# Patient Record
Sex: Male | Born: 1960 | Race: White | Hispanic: No | Marital: Married | Smoking: Current every day smoker
Health system: Southern US, Community
[De-identification: ages and names within clinical notes are randomized; demographics above are authoritative.]

## PROBLEM LIST (undated history)

## (undated) DIAGNOSIS — I10 Essential (primary) hypertension: Secondary | ICD-10-CM

## (undated) DIAGNOSIS — C50911 Malignant neoplasm of unspecified site of right female breast: Secondary | ICD-10-CM

## (undated) DIAGNOSIS — C155 Malignant neoplasm of lower third of esophagus: Secondary | ICD-10-CM

## (undated) HISTORY — DX: Malignant neoplasm of unspecified site of right female breast: C50.911

## (undated) HISTORY — PX: CHOLECYSTECTOMY: SHX55

## (undated) HISTORY — DX: Malignant neoplasm of lower third of esophagus: C15.5

---

## 2018-12-26 ENCOUNTER — Inpatient Hospital Stay (HOSPITAL_COMMUNITY): Payer: Self-pay

## 2018-12-26 ENCOUNTER — Inpatient Hospital Stay (HOSPITAL_COMMUNITY)
Admission: AD | Admit: 2018-12-26 | Discharge: 2018-12-28 | DRG: 066 | Disposition: A | Discharge status: Home / Self Care | Payer: Self-pay | Source: Other Acute Inpatient Hospital | Attending: Internal Medicine | Admitting: Internal Medicine

## 2018-12-26 ENCOUNTER — Encounter (HOSPITAL_COMMUNITY): Payer: Self-pay

## 2018-12-26 DIAGNOSIS — Z7982 Long term (current) use of aspirin: Secondary | ICD-10-CM

## 2018-12-26 DIAGNOSIS — Z79899 Other long term (current) drug therapy: Secondary | ICD-10-CM

## 2018-12-26 DIAGNOSIS — I700 Atherosclerosis of aorta: Secondary | ICD-10-CM | POA: Diagnosis present

## 2018-12-26 DIAGNOSIS — E785 Hyperlipidemia, unspecified: Secondary | ICD-10-CM

## 2018-12-26 DIAGNOSIS — H538 Other visual disturbances: Secondary | ICD-10-CM | POA: Diagnosis present

## 2018-12-26 DIAGNOSIS — Z9049 Acquired absence of other specified parts of digestive tract: Secondary | ICD-10-CM

## 2018-12-26 DIAGNOSIS — R7989 Other specified abnormal findings of blood chemistry: Secondary | ICD-10-CM | POA: Diagnosis present

## 2018-12-26 DIAGNOSIS — Z8249 Family history of ischemic heart disease and other diseases of the circulatory system: Secondary | ICD-10-CM

## 2018-12-26 DIAGNOSIS — D509 Iron deficiency anemia, unspecified: Secondary | ICD-10-CM | POA: Diagnosis present

## 2018-12-26 DIAGNOSIS — H53462 Homonymous bilateral field defects, left side: Secondary | ICD-10-CM | POA: Diagnosis present

## 2018-12-26 DIAGNOSIS — I639 Cerebral infarction, unspecified: Secondary | ICD-10-CM | POA: Diagnosis present

## 2018-12-26 DIAGNOSIS — R42 Dizziness and giddiness: Secondary | ICD-10-CM | POA: Diagnosis present

## 2018-12-26 DIAGNOSIS — J439 Emphysema, unspecified: Secondary | ICD-10-CM | POA: Diagnosis present

## 2018-12-26 DIAGNOSIS — F17210 Nicotine dependence, cigarettes, uncomplicated: Secondary | ICD-10-CM | POA: Diagnosis present

## 2018-12-26 DIAGNOSIS — I10 Essential (primary) hypertension: Secondary | ICD-10-CM

## 2018-12-26 DIAGNOSIS — E663 Overweight: Secondary | ICD-10-CM | POA: Diagnosis present

## 2018-12-26 DIAGNOSIS — I6521 Occlusion and stenosis of right carotid artery: Secondary | ICD-10-CM | POA: Diagnosis present

## 2018-12-26 DIAGNOSIS — R29701 NIHSS score 1: Secondary | ICD-10-CM | POA: Diagnosis present

## 2018-12-26 DIAGNOSIS — Z833 Family history of diabetes mellitus: Secondary | ICD-10-CM

## 2018-12-26 DIAGNOSIS — I63431 Cerebral infarction due to embolism of right posterior cerebral artery: Principal | ICD-10-CM | POA: Diagnosis present

## 2018-12-26 DIAGNOSIS — D72829 Elevated white blood cell count, unspecified: Secondary | ICD-10-CM | POA: Diagnosis present

## 2018-12-26 DIAGNOSIS — Z720 Tobacco use: Secondary | ICD-10-CM

## 2018-12-26 DIAGNOSIS — Z6832 Body mass index (BMI) 32.0-32.9, adult: Secondary | ICD-10-CM

## 2018-12-26 HISTORY — DX: Essential (primary) hypertension: I10

## 2018-12-26 MED ORDER — ACETAMINOPHEN 160 MG/5ML PO SOLN: 650.0000 mg | ORAL | Status: DC | PRN

## 2018-12-26 MED ORDER — LORATADINE 10 MG PO TABS
10.0000 mg | ORAL_TABLET | Freq: Every day | ORAL | Status: DC
  Administered 2018-12-27 – 2018-12-28 (×2): 10 mg via ORAL
  Filled 2018-12-26 (×2): qty 1

## 2018-12-26 MED ORDER — STROKE: EARLY STAGES OF RECOVERY BOOK
Freq: Once | Status: AC
  Administered 2018-12-26: 17:00:00 1

## 2018-12-26 MED ORDER — IOHEXOL 350 MG/ML SOLN
100.0000 mL | Freq: Once | INTRAVENOUS | Status: AC | PRN
  Administered 2018-12-26: 100 mL via INTRAVENOUS

## 2018-12-26 MED ORDER — ACETAMINOPHEN 650 MG RE SUPP: 650.0000 mg | RECTAL | Status: DC | PRN

## 2018-12-26 MED ORDER — ACETAMINOPHEN 325 MG PO TABS
650.0000 mg | ORAL_TABLET | ORAL | Status: DC | PRN
  Administered 2018-12-26 – 2018-12-27 (×3): 650 mg via ORAL
  Filled 2018-12-26 (×3): qty 2

## 2018-12-26 MED ORDER — NICOTINE 21 MG/24HR TD PT24
21.0000 mg | MEDICATED_PATCH | Freq: Every day | TRANSDERMAL | Status: DC
  Administered 2018-12-26 – 2018-12-28 (×3): 21 mg via TRANSDERMAL
  Filled 2018-12-26 (×3): qty 1

## 2018-12-26 MED ORDER — HYDRALAZINE HCL 20 MG/ML IJ SOLN: 10.0000 mg | Freq: Four times a day (QID) | INTRAMUSCULAR | Status: DC | PRN

## 2018-12-26 MED ORDER — SODIUM CHLORIDE 0.9 % IV SOLN
INTRAVENOUS | Status: DC
  Administered 2018-12-26: 17:00:00 via INTRAVENOUS

## 2018-12-26 MED ORDER — TRAMADOL HCL 50 MG PO TABS: 50.0000 mg | ORAL_TABLET | Freq: Four times a day (QID) | ORAL | Status: DC | PRN

## 2018-12-26 MED ORDER — ENOXAPARIN SODIUM 40 MG/0.4ML ~~LOC~~ SOLN
40.0000 mg | SUBCUTANEOUS | Status: DC
  Administered 2018-12-26: 23:00:00 40 mg via SUBCUTANEOUS
  Filled 2018-12-26 (×2): qty 0.4

## 2018-12-26 MED ORDER — SENNOSIDES-DOCUSATE SODIUM 8.6-50 MG PO TABS: 1.0000 | ORAL_TABLET | Freq: Every evening | ORAL | Status: DC | PRN

## 2018-12-26 NOTE — Consult Note (Signed)
V[facility]ist of [place]  Patient name: Marc Terry MRN: [id number] DOB: 1961-07-26 Sex: male  REASON FOR CONSULT: Evaluation of right carotid stenosis  HPI: Marc Terry is a 58 y.o. male, who is seen for evaluation of neurologic change and right carotid stenosis.  He reports that 6 days ago he had an episode where he noted a headache mostly on the right side and blurred vision bilaterally when he awoke.  This did not resolve and he saw his primary care physician.  He was felt to have a sinus infection and was started on antibiotics.  He also underwent carotid duplex in his internal medicine physician's office and this was interpreted as 90% right carotid stenosis.  He reports that he did not have any worsening but also did not having improvement and therefore presented to the [facility] emergency department earlier today.  CT scan showed right posterior stroke.  He was transferred to [facility] for neurology consultation and vascular surgery consultation.  He is right-handed.  He has had no focal neurologic deficits or a aphasia.  He denies any prior similar events.  He denies any history of cardiac disease.  He is a cigarette smoker.  Past Medical History:  Diagnosis Date  . Hypertension     No family history on file.  SOCIAL HISTORY: Social History   Socioeconomic History  . Marital status: Married    Spouse name: Not on file  . Number of children: Not on file  . Years of education: Not on file  . Highest education level: Not on file  Occupational History  . Not on file  Social Needs  . Financial resource strain: Not on file  . Food insecurity:    Worry: Not on file    Inability: Not on file  . Transportation needs:    Medical: Not on file    Non-medical: Not on file  Tobacco Use  . Smoking status: Current Every Day Smoker    Packs/day: 2.00    Years: 20.00    Pack years: 40.00    Types: Cigarettes   Substance and Sexual Activity  . Alcohol use: Never    Frequency: Never  . Drug use: Never  . Sexual activity: Not on file  Lifestyle  . Physical activity:    Days per week: Not on file    Minutes per session: Not on file  . Stress: Not on file  Relationships  . Social connections:    Talks on phone: Not on file    Gets together: Not on file    Attends religious service: Not on file    Active member of club or organization: Not on file    Attends meetings of clubs or organizations: Not on file    Relationship status: Not on file  . Intimate partner violence:    Fear of current or ex partner: Not on file    Emotionally abused: Not on file    Physically abused: Not on file    Forced sexual activity: Not on file  Other Topics Concern  . Not on file  Social History Narrative  . Not on file    No Known Allergies  Current Facility-Administered Medications  Medication Dose Route Frequency Provider Last Rate Last Dose  .  stroke: mapping our early stages of recovery book   Does not apply Once Tat, David, MD      . 0.9 %  sodium chloride infusion   Intravenous Continuous  Orson Eva, MD      . acetaminophen (TYLENOL) tablet 650 mg  650 mg Oral Q4H PRN Tat, Shanon Brow, MD       Or  . acetaminophen (TYLENOL) solution 650 mg  650 mg Per Tube Q4H PRN Tat, Shanon Brow, MD       Or  . acetaminophen (TYLENOL) suppository 650 mg  650 mg Rectal Q4H PRN Tat, Shanon Brow, MD      . enoxaparin (LOVENOX) injection 40 mg  40 mg Subcutaneous Q24H Tat, David, MD      . hydrALAZINE (APRESOLINE) injection 10 mg  10 mg Intravenous Q6H PRN Tat, Shanon Brow, MD      . Derrill Memo ON 12/27/2018] loratadine (CLARITIN) tablet 10 mg  10 mg Oral Daily Tat, David, MD      . senna-docusate (Senokot-S) tablet 1 tablet  1 tablet Oral QHS PRN Tat, Shanon Brow, MD      . traMADol Veatrice Bourbon) tablet 50 mg  50 mg Oral Q6H PRN Tat, David, MD        REVIEW OF SYSTEMS:  Reviewed in his history and physical with nothing to add   PHYSICAL EXAM:  Vitals:   12/26/18 1555  BP: (!) 147/79  Pulse: 95  Resp: 18  Temp: 98.6 F (37 C)  TempSrc: Oral  SpO2: 100%  Weight: 88.1 kg  Height: 5\' 5"  (1.651 m)    GENERAL: The patient is a well-nourished male, in no acute distress. The vital signs are documented above. CARDIOVASCULAR: 2+ radial and 2+ dorsalis pedis pulses bilaterally PULMONARY: There is good air exchange  ABDOMEN: Soft and non-tender  MUSCULOSKELETAL: There are no major deformities or cyanosis. NEUROLOGIC: No focal weakness or paresthesias are detected. SKIN: There are no ulcers or rashes noted. PSYCHIATRIC: The patient has a normal affect.  DATA:  CT from [place] reviewed.  I cannot obtain results or images from his outpatient physician from [place]: New neurologic event 6 days ago with no progression.  Reportedly 90% right internal carotid artery stenosis by duplex.  Probable posterior brain stroke.  MRI of brain and CTA of neck and head pending  I had long discussion with the patient regarding this.  Explained that if he indeed does have a 90% carotid stenosis that we would recommend right carotid endarterectomy.  Explained that this may be an incidental finding and not related to his posterior symptoms.  Also explained that this could potentially be false positive finding at his outpatient lab and would await CT angiogram findings.  I did discuss the operative details of carotid endarterectomy with the patient.  Explained the timing would depend on findings from his MRI and CT.  Will follow   Rosetta Posner, MD [facility] and Ve[facility] Specialists of [facility] Tel [phone] Pager [phone]

## 2018-12-26 NOTE — Consult Note (Signed)
Referring Physician: Dr. Carles Collet    Chief Complaint: Peripheral visual loss  HPI: Marc Terry is a right-handed 58 y.o. male with a PMHx of HTN who was transferred to [facility] from [facility] for Neurology and Vascular Surgery consultations after a left PCA stroke was seen on CT. The patient was in his USOH until waking up last Thursday with peripheral vision loss. He cannot recall which side of his vision was worse, but doe remember bumping into something on his left side when ambulating to his bathroom. He also had a severe headache to the top of his head at that time. He went to see his PCP on 4/17, who felt that the patient had a sinus infection, so he was started on ABX. Carotid ultrasound at his PCP's office revealed 90% right carotid stenosis. The patient subsequently went to the [facility] ED today for further evaluation as his vision had still not improved. CT there showed an acute to subacute right PCA stroke in the parieto-occipital region. Teleneurology was consulted and an inpatient neurology evaluation was recommended by them.    The patient denies any other neurological symptoms including no speech deficit, confusion, dysphagia, limb numbness, limb weakness or incoordination. Also with no cough, anosmia, CP, current fever, diarrhea, abdominal pain, rash, facial droop or B/B incontinence.   Past Medical History:  Diagnosis Date  . Hypertension     No family history on file. Social History:  reports that he has been smoking cigarettes. He has a 40.00 pack-year smoking history. He does not have any smokeless tobacco history on file. He reports that he does not drink alcohol or use drugs.  Allergies: No Known Allergies  Medications:  Scheduled: . enoxaparin (LOVENOX) injection  40 mg Subcutaneous Q24H  . [START ON 12/27/2018] loratadine  10 mg Oral Daily    ROS: As per HPI. All other systems negative.   Physical Examination: Blood pressure 135/66, pulse 92, temperature 98.1 F (36.7 C),  temperature source Oral, resp. rate 18, height 5\' 5"  (1.651 m), weight 88.1 kg, SpO2 100 %.  HEENT: [place]/AT Lungs: Respirations unlabored Ext: No edema  Neurologic Examination: Mental Status: Alert, oriented, thought content appropriate.  Speech fluent without evidence of aphasia.  Able to follow all commands without difficulty. Cranial Nerves: II:  Homonymous left inferior quadrantanopsia. PERRL.  III,IV, VI: EOMI without nystagmus. No ptosis.  V,VII: Subtle lag on the left with grimace. Facial temp sensation slightly decreased on the left VIII: hearing intact to voice IX,X: Palate rises symmetrically XI: Symmetric XII: midline tongue extension  Motor: Right : Upper extremity   5/5    Left:     Upper extremity   5/5  Lower extremity   5/5     Lower extremity   5/5 Normal tone throughout; no atrophy noted No pronator drift.  Sensory: Temp and light touch intact x 4 without extinction. Deep Tendon Reflexes:  2+ bilateral brachioradialis and biceps 4+ right patellar (crossed adductor response) 2+ left patellar 2+ achilles bilaterally Plantars: Right: downgoing   Left: downgoing Cerebellar: No ataxia with FNF bilaterally Gait: Deferred  No results found for this or any previous visit (from the past 48 hour(s)). Ct Angio Head W Or Wo Contrast  Result Date: 12/26/2018 CLINICAL DATA:  Abnormal CT. Blurred vision. Dizziness. Loss of peripheral vision. EXAM: CT ANGIOGRAPHY HEAD AND NECK TECHNIQUE: Multidetector CT imaging of the head and neck was performed using the standard protocol during bolus administration of intravenous contrast. Multiplanar CT image reconstructions and MIPs  were obtained to evaluate the vascular anatomy. Carotid stenosis measurements (when applicable) are obtained utilizing NASCET criteria, using the distal internal carotid diameter as the denominator. CONTRAST:  1100mL OMNIPAQUE IOHEXOL 350 MG/ML SOLN COMPARISON:  CT head earlier today at [facility]. FINDINGS:  CT HEAD FINDINGS Brain: Acute appearing RIGHT PCA territory infarct, no definite hemorrhage, primarily affecting the posterior lobe and occipital lobe on the RIGHT. No other areas of concern for ischemia. No hemorrhage, mass lesion, hydrocephalus, or extra-axial fluid. Vascular: Reported separately. Skull: Normal. Negative for fracture or focal lesion. Sinuses: Imaged portions are clear. Orbits: No acute finding. Review of the MIP images confirms the above findings CTA NECK FINDINGS Aortic arch: Standard branching. Imaged portion shows no evidence of aneurysm or dissection. No significant stenosis of the major arch vessel origins. Aortic atherosclerosis. Right carotid system: Calcified and noncalcified stenosis proximal RIGHT internal carotid artery, approximate 60-70% stenosis, based on luminal measurements of 2.0/4.6 proximal/distal. This is borderline flow reducing. More distally, no dissection or fibromuscular change. Left carotid system: No evidence of dissection, stenosis (50% or greater) or occlusion. Nonstenotic atheromatous change at the bifurcation, less severe than on the RIGHT. Vertebral arteries: RIGHT vertebral artery is patent throughout and dominant contributor to the basilar. 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 Muscular collaterals reconstitute V2 and V3. Vessel terminates in PICA. Skeleton: Spondylosis.  Patient is largely edentulous. Other neck: No masses.  Airway patent. Upper chest: Severe emphysematous change.  No pneumothorax. Review of the MIP images confirms the above findings CTA HEAD FINDINGS Anterior circulation: 50% stenosis of the inferior cavernous ICA on the LEFT. Additional 50-75% stenosis of the supraclinoid ICA on the LEFT. ICA terminus widely patent. Nonstenotic atheromatous change of the cavernous and supraclinoid ICA on the RIGHT. ICA terminus widely patent. Anterior and middle cerebral arteries are free from proximal disease. Rudimentary RIGHT PCOM. Mild irregularity  of the BILATERAL M3 MCA branches. Posterior circulation: RIGHT vertebral is the dominant/sole contributor to the basilar. The basilar is widely patent. The P1, P2, P3 segments of the posterior cerebral arteries bilaterally are mildly irregular, but patent without focal stenosis. No cerebellar branch occlusion. Venous sinuses: As permitted by contrast timing, patent. Anatomic variants: None of significance. Specifically no evidence of fetal RIGHT PCA. Delayed phase: No abnormal postcontrast enhancement. Well demarcated RIGHT PCA territory infarct. Review of the MIP images confirms the above findings IMPRESSION: Bland RIGHT PCA territory infarct without significant disease of the dominant RIGHT vertebral, basilar, or RIGHT PCA. No evidence of fetal PCA anatomy. Extracranial atheromatous change at the carotid bifurcations, not clearly flow reducing. 60-70% stenosis on the RIGHT, less than 50% stenosis on the LEFT. Non dominant LEFT vertebral terminates in PICA. Muscular collaterals reconstitute the LEFT vertebral in the neck. Aortic Atherosclerosis (ICD10-I70.0) and Emphysema (ICD10-J43.9). Electronically Signed   By: Staci Righter M.D.   On: 12/26/2018 21:07   Ct Angio Neck W Or Wo Contrast  Result Date: 12/26/2018 CLINICAL DATA:  Abnormal CT. Blurred vision. Dizziness. Loss of peripheral vision. EXAM: CT ANGIOGRAPHY HEAD AND NECK TECHNIQUE: Multidetector CT imaging of the head and neck was performed using the standard protocol during bolus administration of intravenous contrast. Multiplanar CT image reconstructions and MIPs were obtained to evaluate the vascular anatomy. Carotid stenosis measurements (when applicable) are obtained utilizing NASCET criteria, using the distal internal carotid diameter as the denominator. CONTRAST:  145mL OMNIPAQUE IOHEXOL 350 MG/ML SOLN COMPARISON:  CT head earlier today at [facility]. FINDINGS: CT HEAD FINDINGS Brain: Acute appearing  RIGHT PCA territory infarct, no definite  hemorrhage, primarily affecting the posterior lobe and occipital lobe on the RIGHT. No other areas of concern for ischemia. No hemorrhage, mass lesion, hydrocephalus, or extra-axial fluid. Vascular: Reported separately. Skull: Normal. Negative for fracture or focal lesion. Sinuses: Imaged portions are clear. Orbits: No acute finding. Review of the MIP images confirms the above findings CTA NECK FINDINGS Aortic arch: Standard branching. Imaged portion shows no evidence of aneurysm or dissection. No significant stenosis of the major arch vessel origins. Aortic atherosclerosis. Right carotid system: Calcified and noncalcified stenosis proximal RIGHT internal carotid artery, approximate 60-70% stenosis, based on luminal measurements of 2.0/4.6 proximal/distal. This is borderline flow reducing. More distally, no dissection or fibromuscular change. Left carotid system: No evidence of dissection, stenosis (50% or greater) or occlusion. Nonstenotic atheromatous change at the bifurcation, less severe than on the RIGHT. Vertebral arteries: RIGHT vertebral artery is patent throughout and dominant contributor to the basilar. 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 Muscular collaterals reconstitute V2 and V3. Vessel terminates in PICA. Skeleton: Spondylosis.  Patient is largely edentulous. Other neck: No masses.  Airway patent. Upper chest: Severe emphysematous change.  No pneumothorax. Review of the MIP images confirms the above findings CTA HEAD FINDINGS Anterior circulation: 50% stenosis of the inferior cavernous ICA on the LEFT. Additional 50-75% stenosis of the supraclinoid ICA on the LEFT. ICA terminus widely patent. Nonstenotic atheromatous change of the cavernous and supraclinoid ICA on the RIGHT. ICA terminus widely patent. Anterior and middle cerebral arteries are free from proximal disease. Rudimentary RIGHT PCOM. Mild irregularity of the BILATERAL M3 MCA branches. Posterior circulation: RIGHT vertebral is the  dominant/sole contributor to the basilar. The basilar is widely patent. The P1, P2, P3 segments of the posterior cerebral arteries bilaterally are mildly irregular, but patent without focal stenosis. No cerebellar branch occlusion. Venous sinuses: As permitted by contrast timing, patent. Anatomic variants: None of significance. Specifically no evidence of fetal RIGHT PCA. Delayed phase: No abnormal postcontrast enhancement. Well demarcated RIGHT PCA territory infarct. Review of the MIP images confirms the above findings IMPRESSION: Bland RIGHT PCA territory infarct without significant disease of the dominant RIGHT vertebral, basilar, or RIGHT PCA. No evidence of fetal PCA anatomy. Extracranial atheromatous change at the carotid bifurcations, not clearly flow reducing. 60-70% stenosis on the RIGHT, less than 50% stenosis on the LEFT. Non dominant LEFT vertebral terminates in PICA. Muscular collaterals reconstitute the LEFT vertebral in the neck. Aortic Atherosclerosis (ICD10-I70.0) and Emphysema (ICD10-J43.9). Electronically Signed   By: Staci Righter M.D.   On: 12/26/2018 21:07    Assessment: 58 y.o. male presenting with late subacute right parieto-occipital ischemic infarction.  1. Most likely secondary to a distal right MCA branch occlusion based on exam findings and the documented 90% right carotid artery stenosis. The Radiology report documents a right "PCA territory" infarct; however, on my review of the images the infarction location appears more consistent with a distal right MCA branch territory infarction involving the right posterior-inferior parietal lobe with some involvement of the right superior occipital lobe as well.  2. Stroke Risk Factors - HTN and smoking 3. CTA of head and neck reveals a bland RIGHT PCA territory infarct without significant disease of the dominant RIGHT vertebral, basilar, or RIGHT PCA. No evidence of fetal PCA anatomy. Extracranial atheromatous change at the carotid  bifurcations, not clearly flow reducing. 60-70% stenosis on the RIGHT, less than 50% stenosis on the LEFT. Non dominant LEFT vertebral terminates in PICA. Muscular  collaterals reconstitute the LEFT vertebral in the neck. Aortic Atherosclerosis.   Plan: 1. Would restart his antihypertensive medications. No indication for permissive HTN at this time, as he is 7 days out from onset of his initial stroke symptoms.   2. MRI of the brain without contrast. No need for MRA as CTA of head and neck will be obtained.  3. CTA of head and neck.  4. Echocardiogram 5. ASA 325 mg po qd 6. Risk factor modification to include smoking cessation 7. Telemetry monitoring 8. PT consult, OT consult, Speech consult 9. Start atorvastatin 40 mg po qd. Obtain baseline CK level and LFTs.  10. HgbA1c, fasting lipid panel 11. Vascular surgery has been consulted for possible right CEA   @Electronically  signed: Dr. Kerney Elbe 12/26/2018, 9:20 PM

## 2018-12-26 NOTE — Progress Notes (Signed)
Patient arrived to 3W20 from [place] and oriented x4, no complaints of pain.

## 2018-12-26 NOTE — H&P (Signed)
History and Physical  FABIEN TRAVELSTEAD VZD:[id number] DOB: Jul 14, 1961 DOA: 12/26/2018   PCP: Raelyn Number, MD   Patient coming from: Home  Chief Complaint: blurry vision  HPI:  Marc Terry is a 58 y.o. male with medical history of hypertension with no other documented chronic medical problems presenting with blurred vision and dizziness that started on 12/20/2018.  The patient also complained of some visual loss out of his bilateral peripheral visual fields, left greater than right the patient went to see his PCP on 12/21/2018.  He stated an office ultrasound of the neck was performed.  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  The patient was also given a prescription for amoxicillin for presumptive sinus infection apparently, the patient was given outpatient referral for vascular surgery.  However, the patient's symptoms persisted over the next 3 to 4 days.  As result, the patient presented to the emergency department at [facility] on 12/26/2018.  CT of the brain showed an acute to subacute right PCA stroke in the parieto-occipital region.  Tele-neurology was consulted and recommended inpatient neurology consultation.  Vascular surgery was consulted and recommended transfer to [facility] for further evaluation of his possible carotid stenosis. The patient denies any focal extremity weakness, dysesthesia, dysarthria, facial droop, rashes, chest pain, shortness of breath.  He had some subjective fevers approximately 3 days prior to this admission, but he states that this has resolved.  He denies any coughing, shortness of breath, or hemoptysis.  He denies any COVID contacts.  He has been compliant with social distancing.  In the emergency department, BMP was unremarkable.  EKG showed sinus rhythm without any ST-T wave changes.  WBC was 13.0 with hemoglobin 11.3 and platelets 515,000.  Chest x-ray was negative for acute findings.  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 Hypertension    Past surgical hx: cholecystectomy Social History:  reports that he has been smoking cigarettes. He has a 40.00 pack-year smoking history. He does not have any smokeless tobacco history on file. He reports that he does not drink alcohol or use drugs.   Family hx: mother--DM; father HTN  No Known Allergies   Prior to Admission medications   Medication Sig Start Date End Date Taking? Authorizing Provider  amoxicillin (AMOXIL) 875 MG tablet Take 875 mg by mouth 2 (two) times a day. For 14 days 12/21/18   [provider]  loratadine (CLARITIN) 10 MG tablet Take 10 mg by mouth daily. 12/21/18   [provider]  traMADol (ULTRAM) 50 MG tablet Take 50 mg by mouth every 6 (six) hours as needed for pain. 12/21/18   [provider]  valsartan-hydrochlorothiazide (DIOVAN-HCT) 160-12.5 MG tablet Take 1 tablet by mouth daily. 09/06/18   [provider]    Review of Systems:  Constitutional:  No weight loss, night sweats, Fevers, chills, fatigue.  Head&Eyes: No headache. ENT:  No Difficulty swallowing,Tooth/dental problems,Sore throat,  No ear ache, post nasal drip,  Cardio-vascular:  No chest pain, Orthopnea, PND, swelling in lower extremities,  palpitations  GI:  No  abdominal pain, nausea, vomiting, diarrhea, loss of  appetite, hematochezia, melena, heartburn, indigestion, Resp:  No shortness of breath with exertion or at rest. No cough. No coughing up of blood .No wheezing.No chest wall deformity  Skin:  no rash or lesions.  GU:  no dysuria, change in color of urine, no urgency or frequency. No flank pain.  Musculoskeletal:  No joint pain or swelling. No decreased range of motion. No back pain.  Psych:  No change in mood or affect. No depression or anxiety. Neurologic: No headache, no dysesthesia, no focal weakness, no vision loss. No syncope  Physical Exam: Vitals:   12/26/18 1555  BP: (!) 147/79  Pulse: 95  Resp: 18  Temp: 98.6 F (37 C)  TempSrc: Oral  SpO2: 100%  Weight: 88.1 kg  Height: 5\' 5"  (1.651 m)   General:  A&O x 3, NAD, nontoxic, pleasant/cooperative Head/Eye: No conjunctival hemorrhage, no icterus, [place]/AT, No nystagmus ENT:  No icterus,  No thrush, good dentition, no pharyngeal exudate Neck:  No masses, no lymphadenpathy, no bruits CV:  RRR, no rub, no gallop, no S3 Lung:  CTAB, good air movement, no wheeze, no rhonchi Abdomen: soft/NT, +BS, nondistended, no peritoneal signs Ext: No cyanosis, No rashes, No petechiae, No lymphangitis, No edema Neuro: CNII-XII intact, strength 4/5 in bilateral upper and lower extremities, no dysmetria  Labs on Admission:  Basic Metabolic Panel: No results for input(s): NA, K, CL, CO2, GLUCOSE, BUN, CREATININE, CALCIUM, MG, PHOS in the last 168 hours. Liver Function Tests: No results for input(s): AST, ALT, ALKPHOS, BILITOT, PROT, ALBUMIN in the last 168 hours. No results for input(s): LIPASE, AMYLASE in the last 168 hours. No results for input(s): AMMONIA in the last 168 hours. CBC: No results for input(s): WBC, NEUTROABS, HGB, HCT, MCV, PLT in the last 168 hours. Coagulation Profile: No results for input(s): INR, PROTIME in the last 168 hours. Cardiac Enzymes: No results for input(s): CKTOTAL, CKMB, CKMBINDEX, TROPONINI in the last 168  hours. BNP: Invalid input(s): POCBNP CBG: No results for input(s): GLUCAP in the last 168 hours. Urine analysis: No results found for: COLORURINE, APPEARANCEUR, LABSPEC, PHURINE, GLUCOSEU, HGBUR, BILIRUBINUR, KETONESUR, PROTEINUR, UROBILINOGEN, NITRITE, LEUKOCYTESUR Sepsis Labs: @LABRCNTIP (procalcitonin:4,lacticidven:4) )No results found for this or any previous visit (from the past 240 hour(s)).   Radiological Exams on Admission: No results found.  EKG: Independently reviewed. Sinus, no STT changes    Time spent:60 minutes Code Status:   FULL Family Communication:  No Family at bedside Disposition Plan: expect 1-2 day hospitalization Consults called: neurology and VVS  DVT Prophylaxis: [place] Lovenox  Orson Eva, DO  [facility] Pager [phone]  If 7PM-7AM, please contact night-coverage [email or web address] Password [facility] 12/26/2018, 4:37 PM

## 2018-12-27 ENCOUNTER — Inpatient Hospital Stay (HOSPITAL_COMMUNITY): Payer: Self-pay

## 2018-12-27 DIAGNOSIS — I361 Nonrheumatic tricuspid (valve) insufficiency: Secondary | ICD-10-CM

## 2018-12-27 DIAGNOSIS — E785 Hyperlipidemia, unspecified: Secondary | ICD-10-CM

## 2018-12-27 DIAGNOSIS — I6523 Occlusion and stenosis of bilateral carotid arteries: Secondary | ICD-10-CM

## 2018-12-27 DIAGNOSIS — I6521 Occlusion and stenosis of right carotid artery: Secondary | ICD-10-CM

## 2018-12-27 DIAGNOSIS — I639 Cerebral infarction, unspecified: Secondary | ICD-10-CM

## 2018-12-27 DIAGNOSIS — Z720 Tobacco use: Secondary | ICD-10-CM

## 2018-12-27 LAB — URINALYSIS, COMPLETE (UACMP) WITH MICROSCOPIC
Bacteria, UA: NONE SEEN
Bilirubin Urine: NEGATIVE
Glucose, UA: NEGATIVE mg/dL
Hgb urine dipstick: NEGATIVE
Ketones, ur: NEGATIVE mg/dL
Leukocytes,Ua: NEGATIVE
Nitrite: NEGATIVE
Protein, ur: NEGATIVE mg/dL
Specific Gravity, Urine: 1.025 (ref 1.005–1.030)
pH: 5 (ref 5.0–8.0)

## 2018-12-27 LAB — ECHOCARDIOGRAM COMPLETE
Height: 65 in
Weight: 3104 oz

## 2018-12-27 LAB — BASIC METABOLIC PANEL
Anion gap: 10 (ref 5–15)
BUN: 13 mg/dL (ref 6–20)
CO2: 22 mmol/L (ref 22–32)
Calcium: 8.8 mg/dL — ABNORMAL LOW (ref 8.9–10.3)
Chloride: 104 mmol/L (ref 98–111)
Creatinine, Ser: 1.12 mg/dL (ref 0.61–1.24)
GFR calc Af Amer: >60 mL/min (ref >60)
GFR calc non Af Amer: >60 mL/min (ref >60)
Glucose, Bld: 108 mg/dL — ABNORMAL HIGH (ref 70–99)
Potassium: 4 mmol/L (ref 3.5–5.1)
Sodium: 136 mmol/L (ref 135–145)

## 2018-12-27 LAB — CBC
HCT: 32 % — ABNORMAL LOW (ref 39.0–52.0)
Hemoglobin: 10 g/dL — ABNORMAL LOW (ref 13.0–17.0)
MCH: 26 pg (ref 26.0–34.0)
MCHC: 31.3 g/dL (ref 30.0–36.0)
MCV: 83.1 fL (ref 80.0–100.0)
Platelets: 410 10*3/uL — ABNORMAL HIGH (ref 150–400)
RBC: 3.85 MIL/uL — ABNORMAL LOW (ref 4.22–5.81)
RDW: 15.7 % — ABNORMAL HIGH (ref 11.5–15.5)
WBC: 14 10*3/uL — ABNORMAL HIGH (ref 4.0–10.5)
nRBC: 0 % (ref 0.0–0.2)

## 2018-12-27 LAB — RETICULOCYTES
Immature Retic Fract: 24.8 % — ABNORMAL HIGH (ref 2.3–15.9)
RBC.: 3.85 MIL/uL — ABNORMAL LOW (ref 4.22–5.81)
Retic Count, Absolute: 101 10*3/uL (ref 19.0–186.0)
Retic Ct Pct: 2.6 % (ref 0.4–3.1)

## 2018-12-27 LAB — LIPID PANEL
Cholesterol: 135 mg/dL (ref 0–200)
HDL: 30 mg/dL — ABNORMAL LOW (ref >40)
LDL Cholesterol: 91 mg/dL (ref 0–99)
Total CHOL/HDL Ratio: 4.5 RATIO
Triglycerides: 68 mg/dL (ref <150)
VLDL: 14 mg/dL (ref 0–40)

## 2018-12-27 LAB — IRON AND TIBC
Iron: 24 ug/dL — ABNORMAL LOW (ref 45–182)
Saturation Ratios: 6 % — ABNORMAL LOW (ref 17.9–39.5)
TIBC: 372 ug/dL (ref 250–450)
UIBC: 348 ug/dL

## 2018-12-27 LAB — HEMOGLOBIN A1C
Hgb A1c MFr Bld: 5.4 % (ref 4.8–5.6)
Mean Plasma Glucose: 108.28 mg/dL

## 2018-12-27 LAB — SEDIMENTATION RATE: Sed Rate: 26 mm/hr — ABNORMAL HIGH (ref 0–16)

## 2018-12-27 LAB — HIV ANTIBODY (ROUTINE TESTING W REFLEX): HIV Screen 4th Generation wRfx: NONREACTIVE

## 2018-12-27 LAB — FOLATE: Folate: 9.4 ng/mL (ref >5.9)

## 2018-12-27 LAB — VITAMIN B12: Vitamin B-12: 358 pg/mL (ref 180–914)

## 2018-12-27 LAB — TSH: TSH: 3.575 u[IU]/mL (ref 0.350–4.500)

## 2018-12-27 LAB — FERRITIN: Ferritin: 10 ng/mL — ABNORMAL LOW (ref 24–336)

## 2018-12-27 LAB — C-REACTIVE PROTEIN: CRP: 3.7 mg/dL — ABNORMAL HIGH (ref <1.0)

## 2018-12-27 MED ORDER — ATORVASTATIN CALCIUM 40 MG PO TABS
40.0000 mg | ORAL_TABLET | Freq: Every day | ORAL | Status: DC
  Administered 2018-12-27: 40 mg via ORAL
  Filled 2018-12-27: qty 1

## 2018-12-27 MED ORDER — ASPIRIN 81 MG PO TBEC: 81.0000 mg | DELAYED_RELEASE_TABLET | Freq: Every day | ORAL | Status: DC

## 2018-12-27 MED ORDER — ATORVASTATIN CALCIUM 40 MG PO TABS: 40.0000 mg | ORAL_TABLET | Freq: Every day | ORAL | 1 refills | Status: AC

## 2018-12-27 MED ORDER — ASPIRIN EC 81 MG PO TBEC
81.0000 mg | DELAYED_RELEASE_TABLET | Freq: Every day | ORAL | Status: DC
  Administered 2018-12-27 – 2018-12-28 (×2): 81 mg via ORAL
  Filled 2018-12-27 (×2): qty 1

## 2018-12-27 MED ORDER — CLOPIDOGREL BISULFATE 75 MG PO TABS
75.0000 mg | ORAL_TABLET | Freq: Every day | ORAL | Status: DC
  Administered 2018-12-27 – 2018-12-28 (×2): 75 mg via ORAL
  Filled 2018-12-27 (×2): qty 1

## 2018-12-27 NOTE — Progress Notes (Addendum)
PROGRESS NOTE  Marc Terry OQH:[id number] DOB: 01-21-1961 DOA: 12/26/2018 PCP: Raelyn Number, MD  Brief History:   58 y.o. male with medical history of hypertension with no other documented chronic medical problems presenting with blurred vision and dizziness that started on 12/20/2018.  The patient also complained of some visual loss out of his bilateral peripheral visual fields, left greater than right the patient went to see his PCP on 12/21/2018.  He stated an office ultrasound of the neck was performed.  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  The patient was also given a prescription for amoxicillin for presumptive sinus infection apparently, the patient was given outpatient referral for vascular surgery.  However, the patient's symptoms persisted over the next 3 to 4 days.  As result, the patient presented to the emergency department at [facility] on 12/26/2018.  CT of the brain showed an acute to subacute right PCA stroke in the parieto-occipital region.  Tele-neurology was consulted and recommended inpatient neurology consultation.  Vascular surgery was consulted and recommended transfer to [facility] for further evaluation of his possible carotid stenosis. The patient denies any focal extremity weakness, dysesthesia, dysarthria, facial droop, rashes, chest pain, shortness of breath.  He had some subjective fevers approximately 3 days prior to this admission, but he states that this has resolved.  He denies any coughing, shortness of breath, or hemoptysis.  He denies any COVID contacts.  He has been compliant with social distancing.  In the emergency department, BMP was unremarkable.  EKG showed sinus rhythm without any ST-T wave changes.  WBC was 13.0 with hemoglobin 11.3 and platelets 515,000.  Chest x-ray was negative for acute findings.  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 -Echo--pending -LDL--91 -HbA1C--5.4 -Antiplatelet--ASA 81 mg + plavix 75 mg  Right Carotid Stenosis -VVS following -continue antiplatelets as above -CTA neck as above  Essential Hypertension -holding valsartan/HCTZ to allow for permissive hypertension -hydralazine prn SBP >220  Leukocytosis -Likely stress demargination -Patient is afebrile hemodynamically stable -Obtain urinalysis -Chest x-ray negative for acute findings -hold off on antibiotics  Normocytic anemia -iron saturation 6%,  -B12--358 -folate--9.4 -start po iron  Dyslipidemia -start atorvastatin  Tobacco Abuse -I have discussed tobacco cessation with the patient.  I have counseled the patient regarding the negative impacts of continued tobacco use including but not limited to lung cancer, COPD, and cardiovascular disease.  I have discussed alternatives to tobacco and modalities that may help facilitate tobacco cessation including but not limited to biofeedback, hypnosis, and medications.  Total time spent with tobacco counseling was 4 minutes.      Disposition Plan:   Home when cleared by neurology  Family Communication:   No Family at bedside  Consultants:  neurology  Code Status:  FULL  DVT Prophylaxis:   [place] Lovenox   Procedures: As Listed in Progress Note Above  Antibiotics: None       Subjective: Still having blurry vision out of periphery--about same as last 2 days.  Patient denies fevers, chills, headache, chest pain, dyspnea, nausea, vomiting, diarrhea, abdominal pain, dysuria, hematuria, hematochezia, and melena.  Objective: Vitals:   12/27/18 0150 12/27/18 0350  12/27/18 0413 12/27/18 0737  BP: (!) 157/78 (!) 158/79 (!) 151/74 140/71  Pulse: 79 79 84 94  Resp: 17 18 17 16   Temp: 98.6 F (37 C) 98.1 F (36.7 C) 98 F (36.7 C) 98.5 F (36.9 C)  TempSrc: Oral Oral Oral Oral  SpO2: 95% 99% 100% 100%  Weight:      Height:        Intake/Output Summary (Last 24 hours) at 12/27/2018 0834 Last data filed at 12/27/2018 0737 Gross per 24 hour  Intake 770.96 ml  Output --  Net 770.96 ml   Weight change:  Exam:   General:  Pt is alert, follows commands appropriately, not in acute distress  HEENT: No icterus, No thrush, No neck mass, [place]/AT  Cardiovascular: RRR, S1/S2, no rubs, no gallops  Respiratory: bibasilar rales. No wheeze  Abdomen: Soft/+BS, non tender, non distended, no guarding  Extremities: No edema, No lymphangitis, No petechiae, No rashes, no synovitis   Data Reviewed: I have personally reviewed following labs and imaging studies Basic Metabolic Panel: Recent Labs  Lab 12/27/18 0405  NA 136  K 4.0  CL 104  CO2 22  GLUCOSE 108*  BUN 13  CREATININE 1.12  CALCIUM 8.8*   Liver Function Tests: No results for input(s): AST, ALT, ALKPHOS, BILITOT, PROT, ALBUMIN in the last 168 hours. No results for input(s): LIPASE, AMYLASE in the last 168 hours. No results for input(s): AMMONIA in the last 168 hours. Coagulation Profile: No results for input(s): INR, PROTIME in the last 168 hours. CBC: Recent Labs  Lab 12/27/18 0405  WBC 14.0*  HGB 10.0*  HCT 32.0*  MCV 83.1  PLT 410*   Cardiac Enzymes: No results for input(s): CKTOTAL, CKMB, CKMBINDEX, TROPONINI in the last 168 hours. BNP: Invalid input(s): POCBNP CBG: No results for input(s): GLUCAP in the last 168 hours. HbA1C: Recent Labs    12/27/18 0405  HGBA1C 5.4   Urine analysis: No results found for: COLORURINE, APPEARANCEUR, LABSPEC, PHURINE, GLUCOSEU, HGBUR, BILIRUBINUR, KETONESUR, PROTEINUR, UROBILINOGEN, NITRITE, LEUKOCYTESUR Sepsis  Labs: @LABRCNTIP (procalcitonin:4,lacticidven:4) )No results found for this or any previous visit (from the past 240 hour(s)).   Scheduled Meds:  aspirin EC  81 mg Oral Daily   atorvastatin  40 mg Oral q1800   clopidogrel  75 mg Oral Daily   enoxaparin (LOVENOX) injection  40 mg Subcutaneous Q24H   loratadine  10 mg Oral Daily   nicotine  21 mg Transdermal Daily   Continuous Infusions:  sodium chloride Stopped (12/26/18 1930)    Procedures/Studies: Ct Angio Head W Or Wo Contrast  Result Date: 12/26/2018 CLINICAL DATA:  Abnormal CT. Blurred vision. Dizziness. Loss of peripheral vision. EXAM: CT ANGIOGRAPHY HEAD AND NECK TECHNIQUE: Multidetector CT imaging of the head and neck was performed using the standard protocol during bolus administration of intravenous contrast. Multiplanar CT image reconstructions and MIPs were obtained to evaluate the vascular anatomy. Carotid stenosis measurements (when applicable) are obtained utilizing NASCET criteria, using the distal internal carotid diameter as the denominator. CONTRAST:  13mL OMNIPAQUE IOHEXOL 350 MG/ML SOLN COMPARISON:  CT head earlier today at [facility]. FINDINGS: CT HEAD FINDINGS Brain: Acute appearing RIGHT PCA territory infarct, no definite hemorrhage, primarily affecting the posterior lobe and occipital lobe on the RIGHT. No other areas of concern for ischemia. No hemorrhage, mass lesion, hydrocephalus, or extra-axial fluid. Vascular: Reported separately. Skull: Normal. Negative for fracture or focal lesion. Sinuses: Imaged portions are clear. Orbits:  No acute finding. Review of the MIP images confirms the above findings CTA NECK FINDINGS Aortic arch: Standard branching. Imaged portion shows no evidence of aneurysm or dissection. No significant stenosis of the major arch vessel origins. Aortic atherosclerosis. Right carotid system: Calcified and noncalcified stenosis proximal RIGHT internal carotid artery, approximate 60-70%  stenosis, based on luminal measurements of 2.0/4.6 proximal/distal. This is borderline flow reducing. More distally, no dissection or fibromuscular change. Left carotid system: No evidence of dissection, stenosis (50% or greater) or occlusion. Nonstenotic atheromatous change at the bifurcation, less severe than on the RIGHT. Vertebral arteries: RIGHT vertebral artery is patent throughout and dominant contributor to the basilar. 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 Muscular collaterals reconstitute V2 and V3. Vessel terminates in PICA. Skeleton: Spondylosis.  Patient is largely edentulous. Other neck: No masses.  Airway patent. Upper chest: Severe emphysematous change.  No pneumothorax. Review of the MIP images confirms the above findings CTA HEAD FINDINGS Anterior circulation: 50% stenosis of the inferior cavernous ICA on the LEFT. Additional 50-75% stenosis of the supraclinoid ICA on the LEFT. ICA terminus widely patent. Nonstenotic atheromatous change of the cavernous and supraclinoid ICA on the RIGHT. ICA terminus widely patent. Anterior and middle cerebral arteries are free from proximal disease. Rudimentary RIGHT PCOM. Mild irregularity of the BILATERAL M3 MCA branches. Posterior circulation: RIGHT vertebral is the dominant/sole contributor to the basilar. The basilar is widely patent. The P1, P2, P3 segments of the posterior cerebral arteries bilaterally are mildly irregular, but patent without focal stenosis. No cerebellar branch occlusion. Venous sinuses: As permitted by contrast timing, patent. Anatomic variants: None of significance. Specifically no evidence of fetal RIGHT PCA. Delayed phase: No abnormal postcontrast enhancement. Well demarcated RIGHT PCA territory infarct. Review of the MIP images confirms the above findings IMPRESSION: Bland RIGHT PCA territory infarct without significant disease of the dominant RIGHT vertebral, basilar, or RIGHT PCA. No evidence of fetal PCA anatomy.  Extracranial atheromatous change at the carotid bifurcations, not clearly flow reducing. 60-70% stenosis on the RIGHT, less than 50% stenosis on the LEFT. Non dominant LEFT vertebral terminates in PICA. Muscular collaterals reconstitute the LEFT vertebral in the neck. Aortic Atherosclerosis (ICD10-I70.0) and Emphysema (ICD10-J43.9). Electronically Signed   By: Staci Righter M.D.   On: 12/26/2018 21:07   Ct Angio Neck W Or Wo Contrast  Result Date: 12/26/2018 CLINICAL DATA:  Abnormal CT. Blurred vision. Dizziness. Loss of peripheral vision. EXAM: CT ANGIOGRAPHY HEAD AND NECK TECHNIQUE: Multidetector CT imaging of the head and neck was performed using the standard protocol during bolus administration of intravenous contrast. Multiplanar CT image reconstructions and MIPs were obtained to evaluate the vascular anatomy. Carotid stenosis measurements (when applicable) are obtained utilizing NASCET criteria, using the distal internal carotid diameter as the denominator. CONTRAST:  120mL OMNIPAQUE IOHEXOL 350 MG/ML SOLN COMPARISON:  CT head earlier today at [facility]. FINDINGS: CT HEAD FINDINGS Brain: Acute appearing RIGHT PCA territory infarct, no definite hemorrhage, primarily affecting the posterior lobe and occipital lobe on the RIGHT. No other areas of concern for ischemia. No hemorrhage, mass lesion, hydrocephalus, or extra-axial fluid. Vascular: Reported separately. Skull: Normal. Negative for fracture or focal lesion. Sinuses: Imaged portions are clear. Orbits: No acute finding. Review of the MIP images confirms the above findings CTA NECK FINDINGS Aortic arch: Standard branching. Imaged portion shows no evidence of aneurysm or dissection. No significant stenosis of the major arch vessel origins. Aortic atherosclerosis. Right carotid system: Calcified and noncalcified stenosis proximal RIGHT internal carotid  artery, approximate 60-70% stenosis, based on luminal measurements of 2.0/4.6 proximal/distal.  This is borderline flow reducing. More distally, no dissection or fibromuscular change. Left carotid system: No evidence of dissection, stenosis (50% or greater) or occlusion. Nonstenotic atheromatous change at the bifurcation, less severe than on the RIGHT. Vertebral arteries: RIGHT vertebral artery is patent throughout and dominant contributor to the basilar. 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 Muscular collaterals reconstitute V2 and V3. Vessel terminates in PICA. Skeleton: Spondylosis.  Patient is largely edentulous. Other neck: No masses.  Airway patent. Upper chest: Severe emphysematous change.  No pneumothorax. Review of the MIP images confirms the above findings CTA HEAD FINDINGS Anterior circulation: 50% stenosis of the inferior cavernous ICA on the LEFT. Additional 50-75% stenosis of the supraclinoid ICA on the LEFT. ICA terminus widely patent. Nonstenotic atheromatous change of the cavernous and supraclinoid ICA on the RIGHT. ICA terminus widely patent. Anterior and middle cerebral arteries are free from proximal disease. Rudimentary RIGHT PCOM. Mild irregularity of the BILATERAL M3 MCA branches. Posterior circulation: RIGHT vertebral is the dominant/sole contributor to the basilar. The basilar is widely patent. The P1, P2, P3 segments of the posterior cerebral arteries bilaterally are mildly irregular, but patent without focal stenosis. No cerebellar branch occlusion. Venous sinuses: As permitted by contrast timing, patent. Anatomic variants: None of significance. Specifically no evidence of fetal RIGHT PCA. Delayed phase: No abnormal postcontrast enhancement. Well demarcated RIGHT PCA territory infarct. Review of the MIP images confirms the above findings IMPRESSION: Bland RIGHT PCA territory infarct without significant disease of the dominant RIGHT vertebral, basilar, or RIGHT PCA. No evidence of fetal PCA anatomy. Extracranial atheromatous change at the carotid bifurcations, not clearly flow  reducing. 60-70% stenosis on the RIGHT, less than 50% stenosis on the LEFT. Non dominant LEFT vertebral terminates in PICA. Muscular collaterals reconstitute the LEFT vertebral in the neck. Aortic Atherosclerosis (ICD10-I70.0) and Emphysema (ICD10-J43.9). Electronically Signed   By: Staci Righter M.D.   On: 12/26/2018 21:07   Mr Brain Wo Contrast  Result Date: 12/26/2018 CLINICAL DATA:  Blurred vision for 1 week. EXAM: MRI HEAD WITHOUT CONTRAST TECHNIQUE: Multiplanar, multiecho pulse sequences of the brain and surrounding structures were obtained without intravenous contrast. COMPARISON:  Head CT 12/26/2018 FINDINGS: BRAIN: Intermediate sized right PCA territory infarct. Moderate cytotoxic edema without significant mass effect. There is an old infarct of the right pons. The midline structures are normal. White matter signal is normal outside of the infarcted area. The cerebral and cerebellar volume are age-appropriate. No hydrocephalus. Susceptibility-sensitive sequences show no chronic microhemorrhage or superficial siderosis. No mass lesion. VASCULAR: The major intracranial arterial and venous sinus flow voids are normal. SKULL AND UPPER CERVICAL SPINE: Calvarial bone marrow signal is normal. There is no skull base mass. Visualized upper cervical spine and soft tissues are normal. SINUSES/ORBITS: No fluid levels or advanced mucosal thickening. No mastoid or middle ear effusion. The orbits are normal. 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 No hemorrhage or mass effect. Electronically Signed   By: Ulyses Jarred M.D.   On: 12/26/2018 22:57    Orson Eva, DO  [facility] Pager [phone]  If 7PM-7AM, please contact night-coverage [email or web address] Password [facility] 12/27/2018, 8:34 AM   LOS: 1 day

## 2018-12-27 NOTE — Plan of Care (Signed)
  Problem: Education: Goal: Knowledge of General Education information will improve Description Including pain rating scale, medication(s)/side effects and non-pharmacologic comfort measures Outcome: Progressing   Problem: Health Behavior/Discharge Planning: Goal: Ability to manage health-related needs will improve Outcome: Progressing   Problem: Clinical Measurements: Goal: Ability to maintain clinical measurements within normal limits will improve Outcome: Progressing Goal: Will remain free from infection Outcome: Progressing Goal: Diagnostic test results will improve Outcome: Progressing Goal: Respiratory complications will improve Outcome: Progressing Goal: Cardiovascular complication will be avoided Outcome: Progressing   Problem: Activity: Goal: Risk for activity intolerance will decrease Outcome: Progressing   Problem: Nutrition: Goal: Adequate nutrition will be maintained Outcome: Progressing   Problem: Coping: Goal: Level of anxiety will decrease Outcome: Progressing   Problem: Elimination: Goal: Will not experience complications related to bowel motility Outcome: Progressing Goal: Will not experience complications related to urinary retention Outcome: Progressing   Problem: Pain Managment: Goal: General experience of comfort will improve Outcome: Progressing   Problem: Safety: Goal: Ability to remain free from injury will improve Outcome: Progressing   Problem: Skin Integrity: Goal: Risk for impaired skin integrity will decrease Outcome: Progressing   Problem: Ischemic Stroke/TIA Tissue Perfusion: Goal: Complications of ischemic stroke/TIA will be minimized Outcome: Progressing   Ival Bible, BSN, RN

## 2018-12-27 NOTE — Progress Notes (Addendum)
STROKE TEAM PROGRESS NOTE   INTERVAL HISTORY Pt sitting in bed, AAOO x3. 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 Pending TCD bubble study, and LE venous doppler.   Vitals:   12/27/18 0150 12/27/18 0350 12/27/18 0413 12/27/18 0737  BP: (!) 157/78 (!) 158/79 (!) 151/74 140/71  Pulse: 79 79 84 94  Resp: 17 18 17 16   Temp: 98.6 F (37 C) 98.1 F (36.7 C) 98 F (36.7 C) 98.5 F (36.9 C)  TempSrc: Oral Oral Oral Oral  SpO2: 95% 99% 100% 100%  Weight:      Height:        CBC:  Recent Labs  Lab 12/27/18 0405  WBC 14.0*  HGB 10.0*  HCT 32.0*  MCV 83.1  PLT 410*    Basic Metabolic Panel:  Recent Labs  Lab 12/27/18 0405  NA 136  K 4.0  CL 104  CO2 22  GLUCOSE 108*  BUN 13  CREATININE 1.12  CALCIUM 8.8*   Lipid Panel:     Component Value Date/Time   CHOL 135 12/27/2018 0405   TRIG 68 12/27/2018 0405   HDL 30 (L) 12/27/2018 0405   CHOLHDL 4.5 12/27/2018 0405   VLDL 14 12/27/2018 0405   LDLCALC 91 12/27/2018 0405   HgbA1c:  Lab Results  Component Value Date   HGBA1C 5.4 12/27/2018   Urine Drug Screen: No results found for: LABOPIA, COCAINSCRNUR, LABBENZ, AMPHETMU, THCU, LABBARB  Alcohol Level No results found for: ETH  IMAGING Ct Angio Head W Or Wo Contrast  Result Date: 12/26/2018 CLINICAL DATA:  Abnormal CT. Blurred vision. Dizziness. Loss of peripheral vision. EXAM: CT ANGIOGRAPHY HEAD AND NECK TECHNIQUE: Multidetector CT imaging of the head and neck was performed using the standard protocol during bolus administration of intravenous contrast. Multiplanar CT image reconstructions and MIPs were obtained to evaluate the vascular anatomy. Carotid stenosis measurements (when applicable) are obtained utilizing NASCET criteria, using the distal internal carotid diameter as the denominator. CONTRAST:  173mL OMNIPAQUE IOHEXOL 350 MG/ML SOLN COMPARISON:  [facility] head earlier [facility] at [facility]. FINDINGS: CT HEAD FINDINGS Brain: Acute appearing RIGHT PCA territory  infarct, no definite hemorrhage, primarily affecting the posterior lobe and occipital lobe on the RIGHT. No other areas of concern for ischemia. No hemorrhage, mass lesion, hydrocephalus, or extra-axial fluid. Vascular: Reported separately. Skull: Normal. Negative for fracture or focal lesion. Sinuses: Imaged portions are clear. Orbits: No acute finding. Review of the MIP images confirms the above findings CTA NECK FINDINGS Aortic arch: Standard branching. Imaged portion shows no evidence of aneurysm or dissection. No significant stenosis of the major arch vessel origins. Aortic atherosclerosis. Right carotid system: Calcified and noncalcified stenosis proximal RIGHT internal carotid artery, approximate 60-70% stenosis, based on luminal measurements of 2.0/4.6 proximal/distal. This is borderline flow reducing. More distally, no dissection or fibromuscular change. Left carotid system: No evidence of dissection, stenosis (50% or greater) or occlusion. Nonstenotic atheromatous change at the bifurcation, less severe than on the RIGHT. Vertebral arteries: RIGHT vertebral artery is patent throughout and dominant contributor to the basilar. 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 Muscular collaterals reconstitute V2 and V3. Vessel terminates in PICA. Skeleton: Spondylosis.  Patient is largely edentulous. Other neck: No masses.  Airway patent. Upper chest: Severe emphysematous change.  No pneumothorax. Review of the MIP images confirms the above findings CTA HEAD FINDINGS Anterior circulation: 50% stenosis of the inferior cavernous ICA on the LEFT. Additional 50-75% stenosis of the supraclinoid ICA on the LEFT. ICA terminus widely  patent. Nonstenotic atheromatous change of the cavernous and supraclinoid ICA on the RIGHT. ICA terminus widely patent. Anterior and middle cerebral arteries are free from proximal disease. Rudimentary RIGHT PCOM. Mild irregularity of the BILATERAL M3 MCA branches. Posterior circulation:  RIGHT vertebral is the dominant/sole contributor to the basilar. The basilar is widely patent. The P1, P2, P3 segments of the posterior cerebral arteries bilaterally are mildly irregular, but patent without focal stenosis. No cerebellar branch occlusion. Venous sinuses: As permitted by contrast timing, patent. Anatomic variants: None of significance. Specifically no evidence of fetal RIGHT PCA. Delayed phase: No abnormal postcontrast enhancement. Well demarcated RIGHT PCA territory infarct. Review of the MIP images confirms the above findings IMPRESSION: Bland RIGHT PCA territory infarct without significant disease of the dominant RIGHT vertebral, basilar, or RIGHT PCA. No evidence of fetal PCA anatomy. Extracranial atheromatous change at the carotid bifurcations, not clearly flow reducing. 60-70% stenosis on the RIGHT, less than 50% stenosis on the LEFT. Non dominant LEFT vertebral terminates in PICA. Muscular collaterals reconstitute the LEFT vertebral in the neck. Aortic Atherosclerosis (ICD10-I70.0) and Emphysema (ICD10-J43.9). Electronically Signed   By: Staci Righter M.D.   On: 12/26/2018 21:07   Ct Angio Neck W Or Wo Contrast  Result Date: 12/26/2018 CLINICAL DATA:  Abnormal CT. Blurred vision. Dizziness. Loss of peripheral vision. EXAM: CT ANGIOGRAPHY HEAD AND NECK TECHNIQUE: Multidetector CT imaging of the head and neck was performed using the standard protocol during bolus administration of intravenous contrast. Multiplanar CT image reconstructions and MIPs were obtained to evaluate the vascular anatomy. Carotid stenosis measurements (when applicable) are obtained utilizing NASCET criteria, using the distal internal carotid diameter as the denominator. CONTRAST:  188mL OMNIPAQUE IOHEXOL 350 MG/ML SOLN COMPARISON:  CT head earlier today at [facility]. FINDINGS: CT HEAD FINDINGS Brain: Acute appearing RIGHT PCA territory infarct, no definite hemorrhage, primarily affecting the posterior lobe and  occipital lobe on the RIGHT. No other areas of concern for ischemia. No hemorrhage, mass lesion, hydrocephalus, or extra-axial fluid. Vascular: Reported separately. Skull: Normal. Negative for fracture or focal lesion. Sinuses: Imaged portions are clear. Orbits: No acute finding. Review of the MIP images confirms the above findings CTA NECK FINDINGS Aortic arch: Standard branching. Imaged portion shows no evidence of aneurysm or dissection. No significant stenosis of the major arch vessel origins. Aortic atherosclerosis. Right carotid system: Calcified and noncalcified stenosis proximal RIGHT internal carotid artery, approximate 60-70% stenosis, based on luminal measurements of 2.0/4.6 proximal/distal. This is borderline flow reducing. More distally, no dissection or fibromuscular change. Left carotid system: No evidence of dissection, stenosis (50% or greater) or occlusion. Nonstenotic atheromatous change at the bifurcation, less severe than on the RIGHT. Vertebral arteries: RIGHT vertebral artery is patent throughout and dominant contributor to the basilar. 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 Muscular collaterals reconstitute V2 and V3. Vessel terminates in PICA. Skeleton: Spondylosis.  Patient is largely edentulous. Other neck: No masses.  Airway patent. Upper chest: Severe emphysematous change.  No pneumothorax. Review of the MIP images confirms the above findings CTA HEAD FINDINGS Anterior circulation: 50% stenosis of the inferior cavernous ICA on the LEFT. Additional 50-75% stenosis of the supraclinoid ICA on the LEFT. ICA terminus widely patent. Nonstenotic atheromatous change of the cavernous and supraclinoid ICA on the RIGHT. ICA terminus widely patent. Anterior and middle cerebral arteries are free from proximal disease. Rudimentary RIGHT PCOM. Mild irregularity of the BILATERAL M3 MCA branches. Posterior circulation: RIGHT vertebral is the dominant/sole contributor to the basilar. The  basilar is  widely patent. The P1, P2, P3 segments of the posterior cerebral arteries bilaterally are mildly irregular, but patent without focal stenosis. No cerebellar branch occlusion. Venous sinuses: As permitted by contrast timing, patent. Anatomic variants: None of significance. Specifically no evidence of fetal RIGHT PCA. Delayed phase: No abnormal postcontrast enhancement. Well demarcated RIGHT PCA territory infarct. Review of the MIP images confirms the above findings IMPRESSION: Bland RIGHT PCA territory infarct without significant disease of the dominant RIGHT vertebral, basilar, or RIGHT PCA. No evidence of fetal PCA anatomy. Extracranial atheromatous change at the carotid bifurcations, not clearly flow reducing. 60-70% stenosis on the RIGHT, less than 50% stenosis on the LEFT. Non dominant LEFT vertebral terminates in PICA. Muscular collaterals reconstitute the LEFT vertebral in the neck. Aortic Atherosclerosis (ICD10-I70.0) and Emphysema (ICD10-J43.9). Electronically Signed   By: Staci Righter M.D.   On: 12/26/2018 21:07   Mr Brain Wo Contrast  Result Date: 12/26/2018 CLINICAL DATA:  Blurred vision for 1 week. EXAM: MRI HEAD WITHOUT CONTRAST TECHNIQUE: Multiplanar, multiecho pulse sequences of the brain and surrounding structures were obtained without intravenous contrast. COMPARISON:  Head CT 12/26/2018 FINDINGS: BRAIN: Intermediate sized right PCA territory infarct. Moderate cytotoxic edema without significant mass effect. There is an old infarct of the right pons. The midline structures are normal. White matter signal is normal outside of the infarcted area. The cerebral and cerebellar volume are age-appropriate. No hydrocephalus. Susceptibility-sensitive sequences show no chronic microhemorrhage or superficial siderosis. No mass lesion. VASCULAR: The major intracranial arterial and venous sinus flow voids are normal. SKULL AND UPPER CERVICAL SPINE: Calvarial bone marrow signal is normal. There is no skull  base mass. Visualized upper cervical spine and soft tissues are normal. SINUSES/ORBITS: No fluid levels or advanced mucosal thickening. No mastoid or middle ear effusion. The orbits are normal. 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 No hemorrhage or mass effect. Electronically Signed   By: Ulyses Jarred M.D.   On: 12/26/2018 22:57   2D Echocardiogram   1. The left ventricle has normal systolic function with an ejection fraction of 60-65%. The cavity size was normal. There is severely increased left ventricular wall thickness. Left ventricular diastolic parameters were normal.  2. The right ventricle has normal systolic function. The cavity was normal. There is no increase in right ventricular wall thickness.  3. Negative bubble study for right to left shunting.  4. Mild thickening of the mitral valve leaflet. Mild calcification of the mitral valve leaflet.  5. The aortic valve is tricuspid. Mild thickening of the aortic valve. Mild calcification of the aortic valve.  6. Mild pulmonic stenosis.   PHYSICAL EXAM  Temp:  [98 F (36.7 C)-98.9 F (37.2 C)] 98.9 F (37.2 C) (04/23 1215) Pulse Rate:  [79-95] 93 (04/23 1215) Resp:  [16-18] 18 (04/23 1215) BP: (139-174)/(66-79) 163/69 (04/23 1215) SpO2:  [95 %-100 %] 100 % (04/23 1215) Weight:  [88 kg-88.1 kg] 88 kg (04/22 2213)  General - Well nourished, well developed, in no apparent distress.  Ophthalmologic - fundi not visualized due to noncooperation.  Cardiovascular - Regular rate and rhythm.  Mental Status -  Level of arousal and orientation to time, place, and person were intact. Language including expression, naming, repetition, comprehension was assessed and found intact. Attention span and concentration were normal. Fund of Knowledge was assessed and was intact.  Cranial Nerves II - XII - II - left lower quadrantanopia. III, IV, VI - Extraocular movements intact. V - Facial sensation intact  bilaterally. VII - Facial movement intact bilaterally. VIII - Hearing & vestibular intact bilaterally. X - Palate elevates symmetrically. XI - Chin turning & shoulder shrug intact bilaterally. XII - Tongue protrusion intact.  Motor Strength - The patient's strength was normal in all extremities and pronator drift was absent.  Bulk was normal and fasciculations were absent.   Motor Tone - Muscle tone was assessed at the neck and appendages and was normal.  Reflexes - The patient's reflexes were symmetrical in all extremities and he had no pathological reflexes.  Sensory - Light touch, temperature/pinprick were assessed and were symmetrical.    Coordination - The patient had normal movements in the hands and feet with no ataxia or dysmetria.  Tremor was absent.  Gait and Station - deferred.   ASSESSMENT/PLAN [name] is a 58 y.o. male with history of HTN presenting to [facility] 4/22 w/ peripheral vision loss since waking last Thurs 4/16 along w/ severe HA. As OP, found to have R ICA 90% stenosis and started on abx for sinus infection. CT at [facility] showed subacute R PCA infarct. Transferred to [facility] for neuro and vascular consult.   Stroke:  right PCA infarct, embolic pattern, secondary to unknown source, cardioembolic vs. Large vessel athero  CT head [place]) subacute R PCA  CTA head & neck bland R PCA infarct. No posterior circulation dz noted. Non-dominant L VA ends in PICA. R ICA 60-70%, L ICA < 50%, both with soft plaques. B/l siphon athero, left ICA distal tandem stenosis. Aortic atherosclerosis. Emphysema.   MRI  R PCA infarct  TCD Doppler w/ bubbles  pending  LE doppler pending  If no PFO or DVT, recommend loop recorder to rule out afib  2D Echo EF 60-65%. No source of embolus. Neg bubble  LDL 91  HgbA1c 5.4  Hypercoagulable work up pending  Lovenox 40 mg sq daily for VTE prophylaxis  aspirin 325 mg daily prior to admission, now on aspirin 81  mg daily and clopidogrel 75 mg daily. Continue DAPT x 3 weeks then plavix alone.   Therapy recommendations:  pending   Disposition:  pending   Pt still has left lower quadrantanopia, recommend ophthalmology follow up ASAP to clear for driving privilege. No driving until then. Pt was educated on this.  Cerebral vascular atherosclerosis  CTA head and neck - R ICA 60-70%, L ICA < 50%, both with soft plaques. B/l siphon athero, left ICA distal tandem stenosis.   OSH CUS reported to showed right ICA 90% stenosis  On DAPT now  Recommend outpt vascular surgery follow up (Dr. Donnetta Hutching)  Hypertension  Stable . Permissive hypertension (OK if < 220/120) but gradually normalize in 2-3 days . Long-term BP goal normotensive  Hyperlipidemia  Home meds:  No statin  Now on lipitor 40  LDL 91, goal < 70  Continue statin at discharge  Tobacco abuse  Current smoker  Smoking cessation counseling provided  Nicotine patch provided  Pt is willing to quit  Other Stroke Risk Factors  Overweight, Body mass index is 32.32 kg/m., recommend weight loss, diet and exercise as appropriate   Other Active Problems  Leukocytosis 14, on abx PTA for sinus infection  normocytic anemia 10. New iron  Hospital day # 1  I spent  35 minutes in total face-to-face time with the patient, more than 50% of which was spent in counseling and coordination of care, reviewing test results, images and medication, and discussing the diagnosis of right PCA infarct,  vascular stenosis, smoker, treatment plan and potential prognosis. This patient's care requiresreview of multiple databases, neurological assessment, discussion with family, other specialists and medical decision making of high complexity. I also discussed with Dr. Fayne Norrie, MD PhD Stroke Neurology 12/27/2018 12:38 PM  To contact Stroke Continuity provider, please refer to [email or web address] After hours, contact General Neurology

## 2018-12-27 NOTE — Discharge Summary (Signed)
Physician Discharge Summary  Marc Terry IRW:[id number] DOB: 1961/02/21 DOA: 12/26/2018  PCP: Raelyn Number, MD  Admit date: 12/26/2018 Discharge date: 12/28/2018  Admitted From: Home Disposition:  Home   Recommendations for Outpatient Follow-up:  1. Follow up with PCP in 1-2 weeks 2. Please obtain BMP/CBC in one week    Discharge Condition: Stable CODE STATUS: FULL Diet recommendation: Heart Healthy   Brief/Interim Summary: 58 y.o.malewith medical history ofhypertension with no other documented chronic medical problems presenting with blurred vision and dizziness that started on 12/20/2018. The patient also complained of some visual loss out of his bilateral peripheral visual fields, left greater than right the patient went to see his PCP on 12/21/2018. He stated an office ultrasound of the neck was performed. 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 The patient was also given a prescription for amoxicillin for presumptive sinus infection apparently, the patient was given outpatient referral for vascular surgery. However, the patient's symptoms persisted over the next 3 to 4 days. As result, the patient presented to the emergency department at [facility] on 12/26/2018. CT of the brain showed an acute to subacute right PCA stroke in the parieto-occipital region. Tele-neurology was consulted and recommended inpatient neurology consultation. Vascular surgery was consulted and recommended transfer to [facility] for further evaluation of his possible carotid stenosis. The patient denies any focal extremity weakness, dysesthesia, dysarthria, facial droop, rashes, chest pain, shortness of breath. He had some subjective fevers approximately 3 days prior to this admission, but he states that this has resolved. He denies any coughing, shortness of breath, or hemoptysis. He denies any COVID contacts. He has been compliant with social  distancing. In the emergency department, BMP was unremarkable. EKG showed sinus rhythm without any ST-T wave changes. WBC was 13.0 with hemoglobin 11.3 and platelets 515,000. Chest x-ray was negative for acute findings.  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 -Echo-EF 65%, LVH, negative bubble study -LDL--91 -HbA1C--5.4 -Antiplatelet--ASA 81 mg + plavix 75 mg x 21 days, then monotherapy with plavix thereafter -venous duplex legs--neg -loop recorder--pt refused after being seen by cardiology -pt will go home with 30 day event monitor--cardiology contacted to set this up after d/c -case discussed with neurology, Dr. Ashby Dawes will need outpt ophthamology follow up prior to driving -hypercoagulable work up initiated--results as below -ANA pending -homocysteine--pending -anti-ds DNA--neg -lupus anticoagulant--neg -anticardiolipin antibody--neg -Beta-2-microglobulin--neg  Right Carotid Stenosis -VVS following-->since stroke involves posterior circulation, Dr. Donzetta Matters feels pt can follow up in office in 6 months with duplex -continue antiplatelets as above -CTA neck as above  Essential Hypertension -holding valsartan/HCTZ to allow for permissive hypertension -hydralazine prn SBP >220 -restart valsartan/HCTZ after d/c  Leukocytosis -Likely stress demargination -Patient is afebrile hemodynamically stable -Obtain urinalysis-no pyuria -Chest x-ray negative for acute findings -hold off on antibiotics -WBC trending down  Normocytic anemia/Iron deficiency Anemia -iron saturation 6%,  ferritin 10 -B12--358 -folate--9.4 -start po iron -thrombocytosis--due to iron deficiency  Dyslipidemia -start atorvastatin 40 mg daily  Tobacco Abuse -I have discussed tobacco cessation with the patient.  I have counseled the patient regarding the negative impacts of continued tobacco use including  but not limited to lung cancer, COPD, and cardiovascular disease.  I have discussed alternatives to tobacco and modalities that may help facilitate tobacco cessation including but not limited to biofeedback, hypnosis, and medications.  Total time spent with tobacco counseling was 4 minutes.   Discharge Instructions  Discharge Instructions    Ambulatory referral to Occupational Therapy   Complete by:  As directed    Ambulatory referral to Physical Therapy   Complete by:  As directed      Allergies as of 12/28/2018   No Known Allergies     Medication List    STOP taking these medications   aspirin 325 MG tablet Replaced by:  aspirin 81 MG EC tablet     TAKE these medications   amoxicillin 875 MG tablet Commonly known as:  AMOXIL Take 875 mg by mouth 2 (two) times a day. For 14 days   aspirin 81 MG EC tablet Take 1 tablet (81 mg total) by mouth daily. X 19 more days Replaces:  aspirin 325 MG tablet   atorvastatin 40 MG tablet Commonly known as:  LIPITOR Take 1 tablet (40 mg total) by mouth daily at 6 PM.   clopidogrel 75 MG tablet Commonly known as:  PLAVIX Take 1 tablet (75 mg total) by mouth daily. Start taking on:  December 29, 2018   ferrous sulfate 325 (65 FE) MG tablet Take 1 tablet (325 mg total) by mouth daily with breakfast. Start taking on:  December 29, 2018   loratadine 10 MG tablet Commonly known as:  CLARITIN Take 10 mg by mouth daily as needed for allergies or rhinitis.   traMADol 50 MG tablet Commonly known as:  ULTRAM Take 50 mg by mouth every 6 (six) hours as needed for pain.   valsartan-hydrochlorothiazide 160-12.5 MG tablet Commonly known as:   DIOVAN-HCT Take 1 tablet by mouth daily.      Follow-up Information    [facility].   Specialty:  Rehabilitation Why:  They will contact you for the first appointment Contact information: [address] [place] [address] [phone]       Constance Haw, MD Follow up.   Specialty:  Cardiology Why:  02/19/2019 @ 4:00PM, follow up on heart monitor findings Contact information: [address] [place] [address] [phone]        [facility] Follow up.   Specialty:  Cardiology Why:  you will be mailed the heart monitor to wear.  Please call the office if you have not received it in the next 7-10 days Contact information: [address] [place] [phone]         No Known Allergies  Consultations:  neurology   Procedures/Studies: Ct Angio Head W Or Wo Contrast  Result Date: 12/26/2018 CLINICAL DATA:  Abnormal CT. Blurred vision. Dizziness. Loss of peripheral vision. EXAM: CT ANGIOGRAPHY HEAD AND NECK TECHNIQUE: Multidetector CT imaging of the head and neck was performed using the standard protocol during bolus administration of intravenous contrast. Multiplanar CT image reconstructions and MIPs were obtained to evaluate the vascular anatomy. Carotid stenosis measurements (when applicable) are obtained utilizing NASCET criteria, using the distal internal carotid diameter as the denominator. CONTRAST:  155mL OMNIPAQUE IOHEXOL 350 MG/ML SOLN COMPARISON:  CT head earlier today at [facility]. FINDINGS: CT HEAD FINDINGS Brain: Acute appearing RIGHT PCA territory infarct, no definite hemorrhage, primarily affecting the posterior lobe  and occipital lobe on the RIGHT. No other areas of concern for ischemia. No hemorrhage, mass lesion, hydrocephalus, or extra-axial fluid. Vascular: Reported separately.  Skull: Normal. Negative for fracture or focal lesion. Sinuses: Imaged portions are clear. Orbits: No acute finding. Review of the MIP images confirms the above findings CTA NECK FINDINGS Aortic arch: Standard branching. Imaged portion shows no evidence of aneurysm or dissection. No significant stenosis of the major arch vessel origins. Aortic atherosclerosis. Right carotid system: Calcified and noncalcified stenosis proximal RIGHT internal carotid artery, approximate 60-70% stenosis, based on luminal measurements of 2.0/4.6 proximal/distal. This is borderline flow reducing. More distally, no dissection or fibromuscular change. Left carotid system: No evidence of dissection, stenosis (50% or greater) or occlusion. Nonstenotic atheromatous change at the bifurcation, less severe than on the RIGHT. Vertebral arteries: RIGHT vertebral artery is patent throughout and dominant contributor to the basilar. 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 Muscular collaterals reconstitute V2 and V3. Vessel terminates in PICA. Skeleton: Spondylosis.  Patient is largely edentulous. Other neck: No masses.  Airway patent. Upper chest: Severe emphysematous change.  No pneumothorax. Review of the MIP images confirms the above findings CTA HEAD FINDINGS Anterior circulation: 50% stenosis of the inferior cavernous ICA on the LEFT. Additional 50-75% stenosis of the supraclinoid ICA on the LEFT. ICA terminus widely patent. Nonstenotic atheromatous change of the cavernous and supraclinoid ICA on the RIGHT. ICA terminus widely patent. Anterior and middle cerebral arteries are free from proximal disease. Rudimentary RIGHT PCOM. Mild irregularity of the BILATERAL M3 MCA branches. Posterior circulation: RIGHT vertebral is the dominant/sole contributor to the basilar. The basilar is widely patent. The P1, P2, P3 segments of the posterior cerebral arteries bilaterally are mildly irregular, but patent without focal stenosis. No cerebellar branch  occlusion. Venous sinuses: As permitted by contrast timing, patent. Anatomic variants: None of significance. Specifically no evidence of fetal RIGHT PCA. Delayed phase: No abnormal postcontrast enhancement. Well demarcated RIGHT PCA territory infarct. Review of the MIP images confirms the above findings IMPRESSION: Bland RIGHT PCA territory infarct without significant disease of the dominant RIGHT vertebral, basilar, or RIGHT PCA. No evidence of fetal PCA anatomy. Extracranial atheromatous change at the carotid bifurcations, not clearly flow reducing. 60-70% stenosis on the RIGHT, less than 50% stenosis on the LEFT. Non dominant LEFT vertebral terminates in PICA. Muscular collaterals reconstitute the LEFT vertebral in the neck. Aortic Atherosclerosis (ICD10-I70.0) and Emphysema (ICD10-J43.9). Electronically Signed   By: Staci Righter M.D.   On: 12/26/2018 21:07   Ct Angio Neck W Or Wo Contrast  Result Date: 12/26/2018 CLINICAL DATA:  Abnormal CT. Blurred vision. Dizziness. Loss of peripheral vision. EXAM: CT ANGIOGRAPHY HEAD AND NECK TECHNIQUE: Multidetector CT imaging of the head and neck was performed using the standard protocol during bolus administration of intravenous contrast. Multiplanar CT image reconstructions and MIPs were obtained to evaluate the vascular anatomy. Carotid stenosis measurements (when applicable) are obtained utilizing NASCET criteria, using the distal internal carotid diameter as the denominator. CONTRAST:  128mL OMNIPAQUE IOHEXOL 350 MG/ML SOLN COMPARISON:  CT head earlier today at [facility]. FINDINGS: CT HEAD FINDINGS Brain: Acute appearing RIGHT PCA territory infarct, no definite hemorrhage, primarily affecting the posterior lobe and occipital lobe on the RIGHT. No other areas of concern for ischemia. No hemorrhage, mass lesion, hydrocephalus, or extra-axial fluid. Vascular: Reported separately. Skull: Normal. Negative for fracture or focal lesion. Sinuses: Imaged portions  are clear. Orbits: No acute finding. Review of the MIP images confirms the above  findings CTA NECK FINDINGS Aortic arch: Standard branching. Imaged portion shows no evidence of aneurysm or dissection. No significant stenosis of the major arch vessel origins. Aortic atherosclerosis. Right carotid system: Calcified and noncalcified stenosis proximal RIGHT internal carotid artery, approximate 60-70% stenosis, based on luminal measurements of 2.0/4.6 proximal/distal. This is borderline flow reducing. More distally, no dissection or fibromuscular change. Left carotid system: No evidence of dissection, stenosis (50% or greater) or occlusion. Nonstenotic atheromatous change at the bifurcation, less severe than on the RIGHT. Vertebral arteries: RIGHT vertebral artery is patent throughout and dominant contributor to the basilar. 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 Muscular collaterals reconstitute V2 and V3. Vessel terminates in PICA. Skeleton: Spondylosis.  Patient is largely edentulous. Other neck: No masses.  Airway patent. Upper chest: Severe emphysematous change.  No pneumothorax. Review of the MIP images confirms the above findings CTA HEAD FINDINGS Anterior circulation: 50% stenosis of the inferior cavernous ICA on the LEFT. Additional 50-75% stenosis of the supraclinoid ICA on the LEFT. ICA terminus widely patent. Nonstenotic atheromatous change of the cavernous and supraclinoid ICA on the RIGHT. ICA terminus widely patent. Anterior and middle cerebral arteries are free from proximal disease. Rudimentary RIGHT PCOM. Mild irregularity of the BILATERAL M3 MCA branches. Posterior circulation: RIGHT vertebral is the dominant/sole contributor to the basilar. The basilar is widely patent. The P1, P2, P3 segments of the posterior cerebral arteries bilaterally are mildly irregular, but patent without focal stenosis. No cerebellar branch occlusion. Venous sinuses: As permitted by contrast timing, patent. Anatomic  variants: None of significance. Specifically no evidence of fetal RIGHT PCA. Delayed phase: No abnormal postcontrast enhancement. Well demarcated RIGHT PCA territory infarct. Review of the MIP images confirms the above findings IMPRESSION: Bland RIGHT PCA territory infarct without significant disease of the dominant RIGHT vertebral, basilar, or RIGHT PCA. No evidence of fetal PCA anatomy. Extracranial atheromatous change at the carotid bifurcations, not clearly flow reducing. 60-70% stenosis on the RIGHT, less than 50% stenosis on the LEFT. Non dominant LEFT vertebral terminates in PICA. Muscular collaterals reconstitute the LEFT vertebral in the neck. Aortic Atherosclerosis (ICD10-I70.0) and Emphysema (ICD10-J43.9). Electronically Signed   By: Staci Righter M.D.   On: 12/26/2018 21:07   Mr Brain Wo Contrast  Result Date: 12/26/2018 CLINICAL DATA:  Blurred vision for 1 week. EXAM: MRI HEAD WITHOUT CONTRAST TECHNIQUE: Multiplanar, multiecho pulse sequences of the brain and surrounding structures were obtained without intravenous contrast. COMPARISON:  Head CT 12/26/2018 FINDINGS: BRAIN: Intermediate sized right PCA territory infarct. Moderate cytotoxic edema without significant mass effect. There is an old infarct of the right pons. The midline structures are normal. White matter signal is normal outside of the infarcted area. The cerebral and cerebellar volume are age-appropriate. No hydrocephalus. Susceptibility-sensitive sequences show no chronic microhemorrhage or superficial siderosis. No mass lesion. VASCULAR: The major intracranial arterial and venous sinus flow voids are normal. SKULL AND UPPER CERVICAL SPINE: Calvarial bone marrow signal is normal. There is no skull base mass. Visualized upper cervical spine and soft tissues are normal. SINUSES/ORBITS: No fluid levels or advanced mucosal thickening. No mastoid or middle ear effusion. The orbits are normal. IMPRESSION: Intermediate sized right PCA  territory acute infarct. No hemorrhage or mass effect. Electronically Signed   By: Ulyses Jarred M.D.   On: 12/26/2018 22:57   Vas [place] Transcranial Doppler W Bubbles  Result Date: 12/27/2018  Transcranial Doppler with Bubble Indications: Stroke. Performing Technologist: Abram Sander RVS  Examination Guidelines: A complete evaluation includes B-mode  imaging, spectral Doppler, color Doppler, and power Doppler as needed of all accessible portions of each vessel. Bilateral testing is considered an integral part of a complete examination. Limited examinations for reoccurring indications may be performed as noted.  Summary:  A vascular evaluation was performed. The right Carotid Siphon was studied. An IV was inserted into the patient's right Forearm. Verbal informed consent was obtained.  Interpretation: HITS heard at rest: None HITS heard during valsalva: None *See table(s) above for measurements and observations.    Preliminary    Vas [place] Lower Extremity Venous (dvt)  Result Date: 12/27/2018  Lower Venous Study Indications: Stroke.  Performing Technologist: Abram Sander RVS  Examination Guidelines: A complete evaluation includes B-mode imaging, spectral Doppler, color Doppler, and power Doppler as needed of all accessible portions of each vessel. Bilateral testing is considered an integral part of a complete examination. Limited examinations for reoccurring indications may be performed as noted.  +---------+---------------+---------+-----------+----------+-------+  RIGHT     Compressibility Phasicity Spontaneity Properties Summary  +---------+---------------+---------+-----------+----------+-------+  CFV       Full            Yes       Yes                             +---------+---------------+---------+-----------+----------+-------+  SFJ       Full                                                      +---------+---------------+---------+-----------+----------+-------+  FV Prox   Full                                                       +---------+---------------+---------+-----------+----------+-------+  FV Mid    Full                                                      +---------+---------------+---------+-----------+----------+-------+  FV Distal Full                                                      +---------+---------------+---------+-----------+----------+-------+  PFV       Full                                                      +---------+---------------+---------+-----------+----------+-------+  POP       Full            Yes       Yes                             +---------+---------------+---------+-----------+----------+-------+  PTV  Full                                                      +---------+---------------+---------+-----------+----------+-------+  PERO      Full                                                      +---------+---------------+---------+-----------+----------+-------+   +---------+---------------+---------+-----------+----------+-------+  LEFT      Compressibility Phasicity Spontaneity Properties Summary  +---------+---------------+---------+-----------+----------+-------+  CFV       Full            Yes       Yes                             +---------+---------------+---------+-----------+----------+-------+  SFJ       Full                                                      +---------+---------------+---------+-----------+----------+-------+  FV Prox   Full                                                      +---------+---------------+---------+-----------+----------+-------+  FV Mid    Full                                                      +---------+---------------+---------+-----------+----------+-------+  FV Distal Full                                                      +---------+---------------+---------+-----------+----------+-------+  PFV       Full                                                       +---------+---------------+---------+-----------+----------+-------+  POP       Full            Yes       Yes                             +---------+---------------+---------+-----------+----------+-------+  PTV       Full                                                      +---------+---------------+---------+-----------+----------+-------+  PERO      Full                                                      +---------+---------------+---------+-----------+----------+-------+     Summary: Right: There is no evidence of deep vein thrombosis in the lower extremity. No cystic structure found in the popliteal fossa. Left: There is no evidence of deep vein thrombosis in the lower extremity. No cystic structure found in the popliteal fossa.  *See table(s) above for measurements and observations. Electronically signed by Servando Snare MD on 12/27/2018 at 5:09:22 PM.    Final         Discharge Exam: Vitals:   12/28/18 0827 12/28/18 1223  BP: (!) 170/76 (!) 164/86  Pulse: 91 83  Resp: 18 16  Temp: 98.2 F (36.8 C) 98.3 F (36.8 C)  SpO2: 99% 100%   Vitals:   12/28/18 0002 12/28/18 0408 12/28/18 0827 12/28/18 1223  BP: (!) 153/82 (!) 151/72 (!) 170/76 (!) 164/86  Pulse: 90 87 91 83  Resp: 18 18 18 16   Temp: 99 F (37.2 C) 98.6 F (37 C) 98.2 F (36.8 C) 98.3 F (36.8 C)  TempSrc: Oral Oral Oral Oral  SpO2: 99% 100% 99% 100%  Weight:      Height:        General: Pt is alert, awake, not in acute distress Cardiovascular: RRR, S1/S2 +, no rubs, no gallops Respiratory: CTA bilaterally, no wheezing, no rhonchi Abdominal: Soft, NT, ND, bowel sounds + Extremities: no edema, no cyanosis   The results of significant diagnostics from this hospitalization (including imaging, microbiology, ancillary and laboratory) are listed below for reference.    Significant Diagnostic Studies: Ct Angio Head W Or Wo Contrast  Result Date: 12/26/2018 CLINICAL DATA:  Abnormal CT. Blurred vision. Dizziness.  Loss of peripheral vision. EXAM: CT ANGIOGRAPHY HEAD AND NECK TECHNIQUE: Multidetector CT imaging of the head and neck was performed using the standard protocol during bolus administration of intravenous contrast. Multiplanar CT image reconstructions and MIPs were obtained to evaluate the vascular anatomy. Carotid stenosis measurements (when applicable) are obtained utilizing NASCET criteria, using the distal internal carotid diameter as the denominator. CONTRAST:  190mL OMNIPAQUE IOHEXOL 350 MG/ML SOLN COMPARISON:  [facility] head earlier [facility] at [facility] FINDINGS: CT HEAD FINDINGS Brain: Acute appearing RIGHT PCA territory infarct, no definite hemorrhage, primarily affecting the posterior lobe and occipital lobe on the RIGHT. No other areas of concern for ischemia. No hemorrhage, mass lesion, hydrocephalus, or extra-axial fluid. Vascular: Reported separately. Skull: Normal. Negative for fracture or focal lesion. Sinuses: Imaged portions are clear. Orbits: No acute finding. Review of the MIP images confirms the above findings CTA NECK FINDINGS Aortic arch: Standard branching. Imaged portion shows no evidence of aneurysm or dissection. No significant stenosis of the major arch vessel origins. Aortic atherosclerosis. Right carotid system: Calcified and noncalcified stenosis proximal RIGHT internal carotid artery, approximate 60-70% stenosis, based on luminal measurements of 2.0/4.6 proximal/distal. This is borderline flow reducing. More distally, no dissection or fibromuscular change. Left carotid system: No evidence of dissection, stenosis (50% or greater) or occlusion. Nonstenotic atheromatous change at the bifurcation, less severe than on the RIGHT. Vertebral arteries: RIGHT vertebral artery is patent throughout and dominant contributor to the basilar. 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 Muscular collaterals reconstitute V2  and V3. Vessel terminates in PICA. Skeleton: Spondylosis.  Patient is largely  edentulous. Other neck: No masses.  Airway patent. Upper chest: Severe emphysematous change.  No pneumothorax. Review of the MIP images confirms the above findings CTA HEAD FINDINGS Anterior circulation: 50% stenosis of the inferior cavernous ICA on the LEFT. Additional 50-75% stenosis of the supraclinoid ICA on the LEFT. ICA terminus widely patent. Nonstenotic atheromatous change of the cavernous and supraclinoid ICA on the RIGHT. ICA terminus widely patent. Anterior and middle cerebral arteries are free from proximal disease. Rudimentary RIGHT PCOM. Mild irregularity of the BILATERAL M3 MCA branches. Posterior circulation: RIGHT vertebral is the dominant/sole contributor to the basilar. The basilar is widely patent. The P1, P2, P3 segments of the posterior cerebral arteries bilaterally are mildly irregular, but patent without focal stenosis. No cerebellar branch occlusion. Venous sinuses: As permitted by contrast timing, patent. Anatomic variants: None of significance. Specifically no evidence of fetal RIGHT PCA. Delayed phase: No abnormal postcontrast enhancement. Well demarcated RIGHT PCA territory infarct. Review of the MIP images confirms the above findings IMPRESSION: Bland RIGHT PCA territory infarct without significant disease of the dominant RIGHT vertebral, basilar, or RIGHT PCA. No evidence of fetal PCA anatomy. Extracranial atheromatous change at the carotid bifurcations, not clearly flow reducing. 60-70% stenosis on the RIGHT, less than 50% stenosis on the LEFT. Non dominant LEFT vertebral terminates in PICA. Muscular collaterals reconstitute the LEFT vertebral in the neck. Aortic Atherosclerosis (ICD10-I70.0) and Emphysema (ICD10-J43.9). Electronically Signed   By: Staci Righter M.D.   On: 12/26/2018 21:07   Ct Angio Neck W Or Wo Contrast  Result Date: 12/26/2018 CLINICAL DATA:  Abnormal CT. Blurred vision. Dizziness. Loss of peripheral vision. EXAM: CT ANGIOGRAPHY HEAD AND NECK TECHNIQUE:  Multidetector CT imaging of the head and neck was performed using the standard protocol during bolus administration of intravenous contrast. Multiplanar CT image reconstructions and MIPs were obtained to evaluate the vascular anatomy. Carotid stenosis measurements (when applicable) are obtained utilizing NASCET criteria, using the distal internal carotid diameter as the denominator. CONTRAST:  152mL OMNIPAQUE IOHEXOL 350 MG/ML SOLN COMPARISON:  CT head earlier today at [facility] FINDINGS: CT HEAD FINDINGS Brain: Acute appearing RIGHT PCA territory infarct, no definite hemorrhage, primarily affecting the posterior lobe and occipital lobe on the RIGHT. No other areas of concern for ischemia. No hemorrhage, mass lesion, hydrocephalus, or extra-axial fluid. Vascular: Reported separately. Skull: Normal. Negative for fracture or focal lesion. Sinuses: Imaged portions are clear. Orbits: No acute finding. Review of the MIP images confirms the above findings CTA NECK FINDINGS Aortic arch: Standard branching. Imaged portion shows no evidence of aneurysm or dissection. No significant stenosis of the major arch vessel origins. Aortic atherosclerosis. Right carotid system: Calcified and noncalcified stenosis proximal RIGHT internal carotid artery, approximate 60-70% stenosis, based on luminal measurements of 2.0/4.6 proximal/distal. This is borderline flow reducing. More distally, no dissection or fibromuscular change. Left carotid system: No evidence of dissection, stenosis (50% or greater) or occlusion. Nonstenotic atheromatous change at the bifurcation, less severe than on the RIGHT. Vertebral arteries: RIGHT vertebral artery is patent throughout and dominant contributor to the basilar. 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 Muscular collaterals reconstitute V2 and V3. Vessel terminates in PICA. Skeleton: Spondylosis.  Patient is largely edentulous. Other neck: No masses.  Airway patent. Upper chest: Severe  emphysematous change.  No pneumothorax. Review of the MIP images confirms the above findings CTA HEAD FINDINGS Anterior circulation: 50% stenosis of the inferior cavernous ICA  on the LEFT. Additional 50-75% stenosis of the supraclinoid ICA on the LEFT. ICA terminus widely patent. Nonstenotic atheromatous change of the cavernous and supraclinoid ICA on the RIGHT. ICA terminus widely patent. Anterior and middle cerebral arteries are free from proximal disease. Rudimentary RIGHT PCOM. Mild irregularity of the BILATERAL M3 MCA branches. Posterior circulation: RIGHT vertebral is the dominant/sole contributor to the basilar. The basilar is widely patent. The P1, P2, P3 segments of the posterior cerebral arteries bilaterally are mildly irregular, but patent without focal stenosis. No cerebellar branch occlusion. Venous sinuses: As permitted by contrast timing, patent. Anatomic variants: None of significance. Specifically no evidence of fetal RIGHT PCA. Delayed phase: No abnormal postcontrast enhancement. Well demarcated RIGHT PCA territory infarct. Review of the MIP images confirms the above findings IMPRESSION: Bland RIGHT PCA territory infarct without significant disease of the dominant RIGHT vertebral, basilar, or RIGHT PCA. No evidence of fetal PCA anatomy. Extracranial atheromatous change at the carotid bifurcations, not clearly flow reducing. 60-70% stenosis on the RIGHT, less than 50% stenosis on the LEFT. Non dominant LEFT vertebral terminates in PICA. Muscular collaterals reconstitute the LEFT vertebral in the neck. Aortic Atherosclerosis (ICD10-I70.0) and Emphysema (ICD10-J43.9). Electronically Signed   By: Staci Righter M.D.   On: 12/26/2018 21:07   Mr Brain Wo Contrast  Result Date: 12/26/2018 CLINICAL DATA:  Blurred vision for 1 week. EXAM: MRI HEAD WITHOUT CONTRAST TECHNIQUE: Multiplanar, multiecho pulse sequences of the brain and surrounding structures were obtained without intravenous contrast.  COMPARISON:  Head CT 12/26/2018 FINDINGS: BRAIN: Intermediate sized right PCA territory infarct. Moderate cytotoxic edema without significant mass effect. There is an old infarct of the right pons. The midline structures are normal. White matter signal is normal outside of the infarcted area. The cerebral and cerebellar volume are age-appropriate. No hydrocephalus. Susceptibility-sensitive sequences show no chronic microhemorrhage or superficial siderosis. No mass lesion. VASCULAR: The major intracranial arterial and venous sinus flow voids are normal. SKULL AND UPPER CERVICAL SPINE: Calvarial bone marrow signal is normal. There is no skull base mass. Visualized upper cervical spine and soft tissues are normal. SINUSES/ORBITS: No fluid levels or advanced mucosal thickening. No mastoid or middle ear effusion. The orbits are normal. 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 No hemorrhage or mass effect. Electronically Signed   By: Ulyses Jarred M.D.   On: 12/26/2018 22:57   Vas [place] Transcranial Doppler W Bubbles  Result Date: 12/27/2018  Transcranial Doppler with Bubble Indications: Stroke. Performing Technologist: Abram Sander RVS  Examination Guidelines: A complete evaluation includes B-mode imaging, spectral Doppler, color Doppler, and power Doppler as needed of all accessible portions of each vessel. Bilateral testing is considered an integral part of a complete examination. Limited examinations for reoccurring indications may be performed as noted.  Summary:  A vascular evaluation was performed. The right Carotid Siphon was studied. An IV was inserted into the patient's right Forearm. Verbal informed consent was obtained.  Interpretation: HITS heard at rest: None HITS heard during valsalva: None *See table(s) above for measurements and observations.    Preliminary    Vas [place] Lower Extremity Venous (dvt)  Result Date: 12/27/2018  Lower Venous Study Indications: Stroke.  Performing  Technologist: Abram Sander RVS  Examination Guidelines: A complete evaluation includes B-mode imaging, spectral Doppler, color Doppler, and power Doppler as needed of all accessible portions of each vessel. Bilateral testing is considered an integral part of a complete examination. Limited examinations for reoccurring indications may be performed as noted.  +---------+---------------+---------+-----------+----------+-------+  RIGHT     Compressibility Phasicity Spontaneity Properties Summary  +---------+---------------+---------+-----------+----------+-------+  CFV       Full            Yes       Yes                             +---------+---------------+---------+-----------+----------+-------+  SFJ       Full                                                      +---------+---------------+---------+-----------+----------+-------+  FV Prox   Full                                                      +---------+---------------+---------+-----------+----------+-------+  FV Mid    Full                                                      +---------+---------------+---------+-----------+----------+-------+  FV Distal Full                                                      +---------+---------------+---------+-----------+----------+-------+  PFV       Full                                                      +---------+---------------+---------+-----------+----------+-------+  POP       Full            Yes       Yes                             +---------+---------------+---------+-----------+----------+-------+  PTV       Full                                                      +---------+---------------+---------+-----------+----------+-------+  PERO      Full                                                      +---------+---------------+---------+-----------+----------+-------+   +---------+---------------+---------+-----------+----------+-------+  LEFT      Compressibility Phasicity Spontaneity Properties Summary   +---------+---------------+---------+-----------+----------+-------+  CFV       Full            Yes  Yes                             +---------+---------------+---------+-----------+----------+-------+  SFJ       Full                                                      +---------+---------------+---------+-----------+----------+-------+  FV Prox   Full                                                      +---------+---------------+---------+-----------+----------+-------+  FV Mid    Full                                                      +---------+---------------+---------+-----------+----------+-------+  FV Distal Full                                                      +---------+---------------+---------+-----------+----------+-------+  PFV       Full                                                      +---------+---------------+---------+-----------+----------+-------+  POP       Full            Yes       Yes                             +---------+---------------+---------+-----------+----------+-------+  PTV       Full                                                      +---------+---------------+---------+-----------+----------+-------+  PERO      Full                                                      +---------+---------------+---------+-----------+----------+-------+     Summary: Right: There is no evidence of deep vein thrombosis in the lower extremity. No cystic structure found in the popliteal fossa. Left: There is no evidence of deep vein thrombosis in the lower extremity. No cystic structure found in the popliteal fossa.  *See table(s) above for measurements and observations. Electronically signed by Servando Snare MD on 12/27/2018 at 5:09:22 PM.    Final      Microbiology: No results found for this or any  previous visit (from the past 240 hour(s)).   Labs: Basic Metabolic Panel: Recent Labs  Lab 12/27/18 0405 12/28/18 0355  NA 136 138  K 4.0 4.1  CL 104 104  CO2 22 22    GLUCOSE 108* 119*  BUN 13 16  CREATININE 1.12 1.06  CALCIUM 8.8* 8.6*   Liver Function Tests: No results for input(s): AST, ALT, ALKPHOS, BILITOT, PROT, ALBUMIN in the last 168 hours. No results for input(s): LIPASE, AMYLASE in the last 168 hours. No results for input(s): AMMONIA in the last 168 hours. CBC: Recent Labs  Lab 12/27/18 0405 12/28/18 0355  WBC 14.0* 12.0*  NEUTROABS  --  7.8*  HGB 10.0* 10.4*  HCT 32.0* 35.0*  MCV 83.1 84.5  PLT 410* 464*   Cardiac Enzymes: No results for input(s): CKTOTAL, CKMB, CKMBINDEX, TROPONINI in the last 168 hours. BNP: Invalid input(s): POCBNP CBG: No results for input(s): GLUCAP in the last 168 hours.  Time coordinating discharge:  36 minutes  Signed:  Orson Eva, DO [facility] Pager: [phone] 12/28/2018, 3:00 PM

## 2018-12-27 NOTE — Progress Notes (Signed)
Lower extremity venous and TCD with bubbles has been completed.   Preliminary results in CV Proc.   Abram Sander 12/27/2018 3:05 PM

## 2018-12-27 NOTE — Evaluation (Signed)
Speech Language Pathology Evaluation Patient Details Name: Marc Terry MRN: [id number] DOB: 03-31-1961 Today's Date: 12/27/2018 Time: 0277-4128 SLP Time Calculation (min) (ACUTE ONLY): 13 min  Problem List:  Patient Active Problem List   Diagnosis Date Noted  . Dyslipidemia 12/27/2018  . Tobacco abuse 12/27/2018  . Acute ischemic stroke ([place]) 12/26/2018  . Essential hypertension 12/26/2018   Past Medical History:  Past Medical History:  Diagnosis Date  . Hypertension    Past Surgical History: The histories are not reviewed yet. Please review them in the "History" navigator section and refresh this [place]. HPI:  Marc Terry is a 58 y.o. male with medical history of hypertension presenting with blurred vision and dizziness. Per chart, PCP vist > 90% occlusion right carotid artery. Symptoms continued, pt presented to [facility]. MRI intermediate sized right PCA territory acute infarct in parieto-occipital region.    Assessment / Plan / Recommendation Clinical Impression  Pt's exhibits typical cognitive-linguistic abilities in context of this evaluation. Verbalizations are 100% intelligible. He followed 3 step then abstract commands to 100%. Reported only visual disturbance and headache re: symptoms. At this time ST is not recommended for pt. OT provided pt with strategies re: vision and pursue follow up PRN. Educated and discussed signs of stroke and call 911 if symptoms reoccur.       SLP Assessment  SLP Recommendation/Assessment: Patient does not need any further Speech Lanaguage Pathology Services SLP Visit Diagnosis: Cognitive communication deficit (R41.841)    Follow Up Recommendations  None    Frequency and Duration           SLP Evaluation Cognition  Overall Cognitive Status: Within Functional Limits for tasks assessed Arousal/Alertness: Awake/alert Orientation Level: Oriented X4 Attention: Sustained Sustained Attention: Appears intact Memory: Appears  intact Awareness: Appears intact Problem Solving: Appears intact Safety/Judgment: Appears intact       Comprehension  Auditory Comprehension Overall Auditory Comprehension: Appears within functional limits for tasks assessed Commands: Within Functional Limits(3 step and abstract) Visual Recognition/Discrimination Discrimination: Not tested Reading Comprehension Reading Status: Not tested    Expression Expression Primary Mode of Expression: Verbal Verbal Expression Overall Verbal Expression: Appears within functional limits for tasks assessed Level of Generative/Spontaneous Verbalization: Financial controller Expression Written Expression: Not tested   Oral / Motor  Oral Motor/Sensory Function Overall Oral Motor/Sensory Function: Mild impairment Facial ROM: Reduced left;Suspected CN VII (facial) dysfunction Facial Symmetry: Abnormal symmetry left;Suspected CN VII (facial) dysfunction Lingual ROM: Within Functional Limits Lingual Symmetry: Within Functional Limits Lingual Strength: Within Functional Limits Mandible: Within Functional Limits Motor Speech Overall Motor Speech: Appears within functional limits for tasks assessed Motor Planning: Witnin functional limits   GO                    Houston Siren 12/27/2018, 2:29 PM  Orbie Pyo Colvin Caroli.Ed Risk analyst [phone] Office [phone]

## 2018-12-27 NOTE — Evaluation (Signed)
Physical Therapy Evaluation Patient Details Name: Marc Terry MRN: [id number] DOB: 12/22/1960 Today's Date: 12/27/2018   History of Present Illness  Pt is a 58 y/o male admitted secondary visual changes. MRI of the brain revealed a R PCA infarct. PMH including but not limited to HTN.    Clinical Impression  Pt sitting upright at EOB upon arrival, awake and willing to participate in PT evaluation. Prior to admission, pt reported that he was independent with all functional mobility and ADLs. Pt lives with his wife and children in a single level home with three steps to enter. At the time of evaluation, pt at independent to supervision level for all functional mobility without the use of an AD. Pt participated in a higher level balance assessment and scored a 23/24 on the DGI, indicating that he is a safe community ambulator. No further acute PT needs identified at this time. PT signing off.     Follow Up Recommendations No PT follow up    Equipment Recommendations  None recommended by PT    Recommendations for Other Services       Precautions / Restrictions Precautions Precautions: None Precaution Comments: pt does have high level balance and visual disturbance Restrictions Weight Bearing Restrictions: No      Mobility  Bed Mobility     General bed mobility comments: sitting EOB upon arrival  Transfers Overall transfer level: Independent Equipment used: None             General transfer comment: independent with basic transfers however pt does have high level balance deficits  Ambulation/Gait Ambulation/Gait assistance: Supervision Gait Distance (Feet): 200 Feet Assistive device: None Gait Pattern/deviations: Step-through pattern Gait velocity: able to fluctuate   General Gait Details: no instability or LOB, no need for UE supports or an AD  Stairs Stairs: Yes Stairs assistance: Supervision Stair Management: No rails;Alternating pattern;Forwards Number of  Stairs: 2    Wheelchair Mobility    Modified Rankin (Stroke Patients Only) Modified Rankin (Stroke Patients Only) Pre-Morbid Rankin Score: No symptoms Modified Rankin: Slight disability     Balance Overall balance assessment: Needs assistance Sitting-balance support: No upper extremity supported Sitting balance-Leahy Scale: Good     Standing balance support: No upper extremity supported Standing balance-Leahy Scale: Good Standing balance comment: Pt with impaired visual vestibular integration issues including gaze stabilization with head turns and nods (turns greater than nods, left turn greater than R). Pt was very active prior to this. Pt with mild L drift with functional amubulation when head position/movement and vision challenged                 Standardized Balance Assessment Standardized Balance Assessment : Dynamic Gait Index   Dynamic Gait Index Level Surface: Normal Change in Gait Speed: Normal Gait with Horizontal Head Turns: Mild Impairment Gait with Vertical Head Turns: Normal Gait and Pivot Turn: Normal Step Over Obstacle: Normal Step Around Obstacles: Normal Steps: Normal Total Score: 23       Pertinent Vitals/Pain Pain Assessment: No/denies pain    Home Living Family/patient expects to be discharged to:: Private residence Living Arrangements: Spouse/significant other;Children Available Help at Discharge: Family;Available 24 hours/day Type of Home: House Home Access: Stairs to enter Entrance Stairs-Rails: Right Entrance Stairs-Number of Steps: 3 Home Layout: One level Home Equipment: Crutches      Prior Function Level of Independence: Independent               Hand Dominance  Extremity/Trunk Assessment   Upper Extremity Assessment Upper Extremity Assessment: Defer to OT evaluation;Overall [facility] for tasks assessed    Lower Extremity Assessment Lower Extremity Assessment: Overall WFL for tasks assessed    Cervical /  Trunk Assessment Cervical / Trunk Assessment: Normal  Communication   Communication: No difficulties  Cognition Arousal/Alertness: Awake/alert Behavior During Therapy: WFL for tasks assessed/performed Overall Cognitive Status: Within Functional Limits for tasks assessed                                        General Comments General comments (skin integrity, edema, etc.): see above. Pt with "wine glass" effect with head movement, horizontal greater than vertical    Exercises     Assessment/Plan    PT Assessment Patent does not need any further PT services  PT Problem List         PT Treatment Interventions      PT Goals (Current goals can be found in the Care Plan section)  Acute Rehab PT Goals Patient Stated Goal: "go home" PT Goal Formulation: All assessment and education complete, DC therapy    Frequency     Barriers to discharge        Co-evaluation               AM-PAC PT "6 Clicks" Mobility  Outcome Measure Help needed turning from your back to your side while in a flat bed without using bedrails?: None Help needed moving from lying on your back to sitting on the side of a flat bed without using bedrails?: None Help needed moving to and from a bed to a chair (including a wheelchair)?: None Help needed standing up from a chair using your arms (e.g., wheelchair or bedside chair)?: None Help needed to walk in hospital room?: None Help needed climbing 3-5 steps with a railing? : None 6 Click Score: 24    End of Session   Activity Tolerance: Patient tolerated treatment well Patient left: with call bell/phone within reach;Other (comment)(sitting EOB) Nurse Communication: Mobility status PT Visit Diagnosis: Other symptoms and signs involving the nervous system (A07.622)    Time: 6333-5456 PT Time Calculation (min) (ACUTE ONLY): 14 min   Charges:   PT Evaluation $PT Eval Moderate Complexity: 1 Mod          Sherie Don, PT,  DPT  Acute Rehabilitation Services Pager [phone] Office [place] 12/27/2018, 3:27 PM

## 2018-12-27 NOTE — TOC Initial Note (Signed)
Transition of Care [facility]) - Initial/Assessment Note    Patient Details  Name: Marc Terry MRN: [id number] Date of Birth: 1961-06-11  Transition of Care [facility]) CM/SW Contact:    Pollie Friar, RN Phone Number: 12/27/2018, 2:23 PM  Clinical Narrative:                 Pt doesn't have insurance d/t recent loss of job but feels that he can manage the cost of meds as long as not too expensive. He has PCP. Pt uses [facility] to assist with the cost of meds.   Expected Discharge Plan: OP Rehab Barriers to Discharge: Continued Medical Work up   Patient Goals and CMS Choice Patient states their goals for this hospitalization and ongoing recovery are:: to get home today   Choice offered to / list presented to : Patient  Expected Discharge Plan and Services Expected Discharge Plan: OP Rehab       Living arrangements for the past 2 months: Mobile Home(3 steps to enter)                                      Prior Living Arrangements/Services Living arrangements for the past 2 months: Mobile Home(3 steps to enter) Lives with:: Spouse, Adult Children, Minor Children Patient language and need for interpreter reviewed:: Yes(no needs) Do you feel safe going back to the place where you live?: Yes      Need for Family Participation in Patient Care: No (Comment) Care giver support system in place?: Yes (comment)(has 24 hour supervision and assist at home)   Criminal Activity/Legal Involvement Pertinent to Current Situation/Hospitalization: No - Comment as needed  Activities of Daily Living      Permission Sought/Granted                  Emotional Assessment Appearance:: Appears stated age Attitude/Demeanor/Rapport: Engaged Affect (typically observed): Accepting, Pleasant, Appropriate Orientation: : Oriented to Self, Oriented to Place, Oriented to  Time, Oriented to Situation   Psych Involvement: No (comment)  Admission diagnosis:  SUBACUTE STROKE Patient Active  Problem List   Diagnosis Date Noted  . Dyslipidemia 12/27/2018  . Tobacco abuse 12/27/2018  . Acute ischemic stroke ([place]) 12/26/2018  . Essential hypertension 12/26/2018   PCP:  Raelyn Number, MD Pharmacy:   [id number] - [place], [place] - [address] [place] [address] Phone: [phone] Fax: [phone]     Social Determinants of Health (SDOH) Interventions  wife able to provide needed transportation.  Readmission Risk Interventions No flowsheet data found.

## 2018-12-27 NOTE — Progress Notes (Signed)
  Progress Note    12/27/2018 5:54 PM   Subjective: Still having some issues with vision  Vitals:   12/27/18 1215 12/27/18 1541  BP: (!) 163/69 (!) 144/81  Pulse: 93 95  Resp: 18 18  Temp: 98.9 F (37.2 C) 99.2 F (37.3 C)  SpO2: 100% 100%    Physical Exam: Awake alert oriented Moving all extremities without limitation  CBC    Component Value Date/Time   WBC 14.0 (H) 12/27/2018 0405   RBC 3.85 (L) 12/27/2018 0405   RBC 3.85 (L) 12/27/2018 0405   HGB 10.0 (L) 12/27/2018 0405   HCT 32.0 (L) 12/27/2018 0405   PLT 410 (H) 12/27/2018 0405   MCV 83.1 12/27/2018 0405   MCH 26.0 12/27/2018 0405   MCHC 31.3 12/27/2018 0405   RDW 15.7 (H) 12/27/2018 0405    BMET    Component Value Date/Time   NA 136 12/27/2018 0405   K 4.0 12/27/2018 0405   CL 104 12/27/2018 0405   CO2 22 12/27/2018 0405   GLUCOSE 108 (H) 12/27/2018 0405   BUN 13 12/27/2018 0405   CREATININE 1.12 12/27/2018 0405   CALCIUM 8.8 (L) 12/27/2018 0405   GFRNONAA >60 12/27/2018 0405   GFRAA >60 12/27/2018 0405    INR No results found for: INR   Intake/Output Summary (Last 24 hours) at 12/27/2018 1754 Last data filed at 12/27/2018 1542 Gross per 24 hour  Intake 1010.8 ml  Output -  Net 1010.8 ml     IMPRESSION: Bland RIGHT PCA territory infarct without significant disease of the dominant RIGHT vertebral, basilar, or RIGHT PCA. No evidence of fetal PCA anatomy.  Extracranial atheromatous change at the carotid bifurcations, not clearly flow reducing. 60-70% stenosis on the RIGHT, less than 50% stenosis on the LEFT.  Non dominant LEFT vertebral terminates in PICA. Muscular collaterals reconstitute the LEFT vertebral in the neck.  I have independently reviewed the CT scan which demonstrates the above-noted findings with mostly noncalcific right ICA stenosis between 60 and 70% by NASCET criteria.  Assessment:  58 y.o. male is here with right PCA infarct  Plan: I reviewed the CTA discussed  with Dr. Erlinda Hong does not appear to be flow-limiting stenosis and he thinks that the stroke is clearly in the posterior circulation.  Stroke work-up is ongoing but does not appear to be symptomatic carotid artery stenosis.  As such we will follow-up in our office in 6 months time with duplex.  Katlyn Muldrew C. Donzetta Matters, MD [facility] Office: [phone] Pager: [phone]  12/27/2018 5:54 PM

## 2018-12-27 NOTE — Progress Notes (Signed)
  Echocardiogram 2D Echocardiogram has been performed.  Marc Terry 12/27/2018, 9:42 AM

## 2018-12-27 NOTE — Evaluation (Signed)
Occupational Therapy Evaluation Patient Details Name: Marc Terry MRN: [id number] DOB: 1960-09-24 Today's Date: 12/27/2018    History of Present Illness   57 y.o.malewith medical history ofhypertension with no other documented chronic medical problems presenting with blurred vision and dizziness that started on 12/20/2018. The patient also complained of some visual loss out of his bilateral peripheral visual fields, left greater than right the patient went to see his PCP on 12/21/2018. He stated an office ultrasound of the neck was performed. 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 The patient was also given a prescription for amoxicillin for presumptive sinus infection apparently, the patient was given outpatient referral for vascular surgery. However, the patient's symptoms persisted over the next 3 to 4 days. As result, the patient presented to the emergency department at [facility] on 12/26/2018. CT of the brain showed an acute to subacute right PCA stroke in the parieto-occipital region. Tele-neurology was consulted and recommended inpatient neurology consultation. Vascular surgery was consulted and recommended transfer to [facility] for further evaluation of his possible carotid stenosis.   Clinical Impression   Pt seen for OT evaluation. Pt very participatory and anxious to return home. Upon eval pt presents with the following: diplopia, impaired convergence, impaired visual fields especially L inferior, gaze stabilization deficits, visual vestibular integration deficits, high level balance deficits.  Pt is mod I for basic ADL and IADL activities however would benefit from outpatient therapy  to address current deficits. Pt is was very active PTA and wishes to return to PLOF.  Pt in agreement.     Follow Up Recommendations  Outpatient OT(discussed with pt referral for neuro opthamology in order to return to driving and provided referral  information.  Also discussed with PT fndings from OT eval adn recommended that pt follow up with outpatient OT and PT prn)    Equipment Recommendations  None recommended by OT    Recommendations for Other Services       Precautions / Restrictions Precautions Precautions: None Precaution Comments: pt does have high level balance and visual disturbance Restrictions Weight Bearing Restrictions: No      Mobility Bed Mobility Overal bed mobility: Independent                Transfers Overall transfer level: Independent Equipment used: None             General transfer comment: independent with basic transfers however pt does have high level balance deficits    Balance             Standing balance-Leahy Scale: Good Standing balance comment: Pt with impaired visual vestibular integration issues including gaze stabilization with head turns and nods (turns greater than nods, left turn greater than R). Pt was very active prior to this. Pt with mild L drift with functional amubulation when head position/movement and vision challenged                           ADL either performed or assessed with clinical judgement   ADL Overall ADL's : Independent                                             Vision Baseline Vision/History: Wears glasses Wears Glasses: Reading only Patient Visual Report: Diplopia;Blurring of vision;Eye fatigue/eye pain/headache;Peripheral  vision impairment;Other (comment) Vision Assessment?: Yes Eye Alignment: Impaired (comment) Ocular Range of Motion: Within Functional Limits Alignment/Gaze Preference: Head tilt Tracking/Visual Pursuits: Decreased smoothness of horizontal tracking Saccades: Within functional limits Convergence: Impaired (comment) Visual Fields: Left visual field deficit;Right visual field deficit Diplopia Assessment: Objects split side to side;Present in near gaze;Present in primary gaze Depth  Perception: Undershoots Additional Comments: Pt with impaired peripheral fields. L inferior field impaired almost to midline. Inferior fields more impaired than middle and superior (mild). Pt also with gaze stabilization deficits with head turns, horizontal greater than vertical.  Pt experiences 'wine glas or soup bowl" effect with head turns.       Perception     Praxis      Pertinent Vitals/Pain Pain Assessment: No/denies pain(pt states he was having headaches but has not had any since yesterday morning)     Hand Dominance     Extremity/Trunk Assessment Upper Extremity Assessment Upper Extremity Assessment: Overall WFL for tasks assessed           Communication Communication Communication: No difficulties   Cognition Arousal/Alertness: Awake/alert Behavior During Therapy: WFL for tasks assessed/performed Overall Cognitive Status: Within Functional Limits for tasks assessed                                     General Comments  see above. Pt with "wine glass" effect with head movement, horizontal greater than vertical    Exercises     Shoulder Instructions      Home Living Family/patient expects to be discharged to:: Private residence Living Arrangements: Spouse/significant other;Children Available Help at Discharge: Family;Available 24 hours/day Type of Home: House Home Access: Stairs to enter [employer] of Steps: 3 Entrance Stairs-Rails: Right Home Layout: One level     Bathroom Shower/Tub: Walk-in shower;Tub/shower unit   [employer]: Standard     Home Equipment: Crutches          Prior Functioning/Environment Level of Independence: Independent                 OT Problem List: Impaired balance (sitting and/or standing);Impaired vision/perception(high level balance impairments, visual vestibular integration deficits)      OT Treatment/Interventions:  Pt issued visual HEP focused on oculomotor exercises as well as  convergence exercises. Discussed need for neuro ophthalmology in order to ensure safe return to driving and referral information provided to pt. Also discussed that if pt continues to experience visual and high level deficits to seek outpatient therapy - enter name and number provided and pt verbalized understanding.    OT Goals(Current goals can be found in the care plan section) Acute Rehab OT Goals Patient Stated Goal: I want to get home. OT Goal Formulation: All assessment and education complete, DC therapy Time For Goal Achievement: (n/a)  OT Frequency:     Barriers to D/C:            Co-evaluation              AM-PAC OT "6 Clicks" Daily Activity     Outcome Measure Help from another person eating meals?: None Help from another person taking care of personal grooming?: None Help from another person toileting, which includes using toliet, bedpan, or urinal?: None Help from another person bathing (including washing, rinsing, drying)?: None Help from another person to put on and taking off regular upper body clothing?: None Help from another person to put on  and taking off regular lower body clothing?: None 6 Click Score: 24   End of Session    Activity Tolerance: Patient tolerated treatment well Patient left: in bed;with call bell/phone within reach  OT Visit Diagnosis: Unsteadiness on feet (R26.81);Other symptoms and signs involving the nervous system (R29.898)(visual vestibular integration deficits)                Time: 1761-6073 OT Time Calculation (min): 40 min Charges:  OT General Charges $OT Visit: 1 Visit OT Evaluation $OT Eval Low Complexity: 1 Low OT Treatments $Neuromuscular Re-education: 23-37 mins    Quay Burow, OTR?l 12/27/2018, 12:33 PM

## 2018-12-28 ENCOUNTER — Other Ambulatory Visit: Payer: Self-pay | Admitting: Physician Assistant

## 2018-12-28 ENCOUNTER — Encounter (HOSPITAL_COMMUNITY): Payer: Self-pay | Admitting: Cardiology

## 2018-12-28 DIAGNOSIS — I4891 Unspecified atrial fibrillation: Secondary | ICD-10-CM

## 2018-12-28 DIAGNOSIS — I639 Cerebral infarction, unspecified: Secondary | ICD-10-CM

## 2018-12-28 DIAGNOSIS — E669 Obesity, unspecified: Secondary | ICD-10-CM

## 2018-12-28 LAB — DRVVT MIX: dRVVT Mix: 40.7 s (ref 0.0–47.0)

## 2018-12-28 LAB — CBC WITH DIFFERENTIAL/PLATELET
Abs Immature Granulocytes: 0.05 10*3/uL (ref 0.00–0.07)
Basophils Absolute: 0 10*3/uL (ref 0.0–0.1)
Basophils Relative: 0 %
Eosinophils Absolute: 0.2 10*3/uL (ref 0.0–0.5)
Eosinophils Relative: 1 %
HCT: 35 % — ABNORMAL LOW (ref 39.0–52.0)
Hemoglobin: 10.4 g/dL — ABNORMAL LOW (ref 13.0–17.0)
Immature Granulocytes: 0 %
Lymphocytes Relative: 25 %
Lymphs Abs: 3 10*3/uL (ref 0.7–4.0)
MCH: 25.1 pg — ABNORMAL LOW (ref 26.0–34.0)
MCHC: 29.7 g/dL — ABNORMAL LOW (ref 30.0–36.0)
MCV: 84.5 fL (ref 80.0–100.0)
Monocytes Absolute: 1 10*3/uL (ref 0.1–1.0)
Monocytes Relative: 8 %
Neutro Abs: 7.8 10*3/uL — ABNORMAL HIGH (ref 1.7–7.7)
Neutrophils Relative %: 66 %
Platelets: 464 10*3/uL — ABNORMAL HIGH (ref 150–400)
RBC: 4.14 MIL/uL — ABNORMAL LOW (ref 4.22–5.81)
RDW: 15.5 % (ref 11.5–15.5)
WBC: 12 10*3/uL — ABNORMAL HIGH (ref 4.0–10.5)
nRBC: 0 % (ref 0.0–0.2)

## 2018-12-28 LAB — BETA-2-GLYCOPROTEIN I ABS, IGG/M/A
Beta-2 Glyco I IgG: <9 GPI IgG units (ref 0–20)
Beta-2-Glycoprotein I IgA: <9 GPI IgA units (ref 0–25)
Beta-2-Glycoprotein I IgM: <9 GPI IgM units (ref 0–32)

## 2018-12-28 LAB — BASIC METABOLIC PANEL
Anion gap: 12 (ref 5–15)
BUN: 16 mg/dL (ref 6–20)
CO2: 22 mmol/L (ref 22–32)
Calcium: 8.6 mg/dL — ABNORMAL LOW (ref 8.9–10.3)
Chloride: 104 mmol/L (ref 98–111)
Creatinine, Ser: 1.06 mg/dL (ref 0.61–1.24)
GFR calc Af Amer: >60 mL/min (ref >60)
GFR calc non Af Amer: >60 mL/min (ref >60)
Glucose, Bld: 119 mg/dL — ABNORMAL HIGH (ref 70–99)
Potassium: 4.1 mmol/L (ref 3.5–5.1)
Sodium: 138 mmol/L (ref 135–145)

## 2018-12-28 LAB — LUPUS ANTICOAGULANT PANEL
DRVVT: 48.8 s — ABNORMAL HIGH (ref 0.0–47.0)
PTT Lupus Anticoagulant: 39.5 s (ref 0.0–51.9)

## 2018-12-28 LAB — ANTINUCLEAR ANTIBODIES, IFA: ANA Ab, IFA: NEGATIVE

## 2018-12-28 LAB — ANTI-DNA ANTIBODY, DOUBLE-STRANDED: ds DNA Ab: 1 IU/mL (ref 0–9)

## 2018-12-28 LAB — HOMOCYSTEINE: Homocysteine: 20.2 umol/L — ABNORMAL HIGH (ref 0.0–14.5)

## 2018-12-28 LAB — CARDIOLIPIN ANTIBODIES, IGG, IGM, IGA
Anticardiolipin IgA: <9 APL U/mL (ref 0–11)
Anticardiolipin IgG: <9 GPL U/mL (ref 0–14)
Anticardiolipin IgM: <9 MPL U/mL (ref 0–12)

## 2018-12-28 MED ORDER — FERROUS SULFATE 325 (65 FE) MG PO TABS: 325.0000 mg | ORAL_TABLET | Freq: Every day | ORAL | 3 refills | Status: AC

## 2018-12-28 MED ORDER — CLOPIDOGREL BISULFATE 75 MG PO TABS: 75.0000 mg | ORAL_TABLET | Freq: Every day | ORAL | 2 refills | Status: DC

## 2018-12-28 MED ORDER — ASPIRIN 81 MG PO TBEC: 81.0000 mg | DELAYED_RELEASE_TABLET | Freq: Every day | ORAL | Status: DC

## 2018-12-28 MED ORDER — FERROUS SULFATE 325 (65 FE) MG PO TABS: 325.0000 mg | ORAL_TABLET | Freq: Every day | ORAL | Status: DC

## 2018-12-28 NOTE — Consult Note (Signed)
ELECTROPHYSIOLOGY CONSULT NOTE  Patient ID: Marc Terry MRN: [id number], DOB/AGE: [birth year]   Admit date: 12/26/2018 Date of Consult: 12/28/2018  Primary Physician: Raelyn Number, MD Primary Cardiologist: none Reason for Consultation: Cryptogenic stroke ; recommendations regarding Implantable Loop Recorder, requested by Dr. Erlinda Hong  History of Present Illness Marc Terry was admitted on 12/26/2018 with dizziness and visual changes, dx with stroke.   PMHx of HTN only Neurology notesright PCA infarct, embolic pattern, secondary to unknown source, cardioembolic vs. Large vessel athero .  he has undergone workup for stroke including echocardiogram and carotid angio  The patient has been monitored on telemetry which has demonstrated sinus rhythm with no arrhythmias.  Inpatient stroke work-up Harlyn Italiano not include TEE given COVID 19 restrictions.  Vascular consult was obtained, not felt to have flow limiting disease, planned for out patient follow up.    Echocardiogram this admission demonstrated  IMPRESSIONS 1. The left ventricle has normal systolic function with an ejection fraction of 60-65%. The cavity size was normal. There is severely increased left ventricular wall thickness. Left ventricular diastolic parameters were normal.  2. The right ventricle has normal systolic function. The cavity was normal. There is no increase in right ventricular wall thickness.  3. Negative bubble study for right to left shunting.  4. Mild thickening of the mitral valve leaflet. Mild calcification of the mitral valve leaflet.  5. The aortic valve is tricuspid. Mild thickening of the aortic valve. Mild calcification of the aortic valve.  6. Mild pulmonic stenosis.  FINDINGS  Left Ventricle: The left ventricle has normal systolic function, with an ejection fraction of 60-65%. The cavity size was normal. There is severely increased left ventricular wall thickness. Left ventricular diastolic parameters  were normal. Right Ventricle: The right ventricle has normal systolic function. The cavity was normal. There is no increase in right ventricular wall thickness. Left Atrium: Left atrial size was normal in size. Right Atrium: Right atrial size was normal in size. Right atrial pressure is estimated at 10 mmHg Interatrial Septum: No atrial level shunt detected by color flow Doppler. Negative bubble study for right to left shunting. Pericardium: There is no evidence of pericardial effusion. Mitral Valve: The mitral valve is normal in structure. Mild thickening of the mitral valve leaflet. Mild calcification of the mitral valve leaflet. Mitral valve regurgitation is trivial by color flow Doppler Tricuspid Valve: The tricuspid valve is normal in structure. Tricuspid valve regurgitation is mild by color flow Doppler. Aortic Valve: The aortic valve is tricuspid Mild thickening of the aortic valve. Mild calcification of the aortic valve. Aortic valve regurgitation was not visualized by color flow Doppler. There is no evidence of aortic valve stenosis. Pulmonic Valve: The pulmonic valve was grossly normal. Pulmonic valve regurgitation was not assessed by color flow Doppler. Mild pulmonic stenosis. Venous: The inferior vena cava is normal in size with greater than 50% respiratory variability.   Lab work is reviewed. WBC 14 >> 12 today, afebrile, felt 2/2 stress demargination   Given COVID 19, initial HPI is conducted by telephone.  I have called the patient in his room, he confirms name and birth date.  The patient denies any historical or current cardiac awareness.  No CP, palpitations, no exertional incapacities.  No near syncope or syncope.  He confirms no known cardiac history outside of his HTN  He is recovering from their stroke with plans to home at discharge.   Past Medical History:  Diagnosis Date   Hypertension  Surgical History:  Past Surgical History:  Procedure Laterality Date     CHOLECYSTECTOMY       Medications Prior to Admission  Medication Sig Dispense Refill Last Dose   amoxicillin (AMOXIL) 875 MG tablet Take 875 mg by mouth 2 (two) times a day. For 14 days   12/25/2018 at Unknown time   aspirin 325 MG tablet Take 650 mg by mouth daily as needed for mild pain or headache.   Past Month at Unknown time   loratadine (CLARITIN) 10 MG tablet Take 10 mg by mouth daily as needed for allergies or rhinitis.    Past Week at Unknown time   traMADol (ULTRAM) 50 MG tablet Take 50 mg by mouth every 6 (six) hours as needed for pain.   Past Week at Unknown time   valsartan-hydrochlorothiazide (DIOVAN-HCT) 160-12.5 MG tablet Take 1 tablet by mouth daily.   12/25/2018 at Unknown time    Inpatient Medications:   aspirin EC  81 mg Oral Daily   atorvastatin  40 mg Oral q1800   clopidogrel  75 mg Oral Daily   enoxaparin (LOVENOX) injection  40 mg Subcutaneous Q24H   loratadine  10 mg Oral Daily   nicotine  21 mg Transdermal Daily    Allergies: No Known Allergies  Social History   Socioeconomic History   Marital status: Married    Spouse name: Not on file   Number of children: Not on file   Years of education: Not on file   Highest education level: Not on file  Occupational History   Not on file  Social Needs   Financial resource strain: Not on file   Food insecurity:    Worry: Not on file    Inability: Not on file   Transportation needs:    Medical: Not on file    Non-medical: Not on file  Tobacco Use   Smoking status: Current Every Day Smoker    Packs/day: 2.00    Years: 20.00    Pack years: 40.00    Types: Cigarettes  Substance and Sexual Activity   Alcohol use: Never    Frequency: Never   Drug use: Never   Sexual activity: Not on file  Lifestyle   Physical activity:    Days per week: Not on file    Minutes per session: Not on file   Stress: Not on file  Relationships   Social connections:    Talks on phone: Not on file     Gets together: Not on file    Attends religious service: Not on file    Active member of club or organization: Not on file    Attends meetings of clubs or organizations: Not on file    Relationship status: Not on file   Intimate partner violence:    Fear of current or ex partner: Not on file    Emotionally abused: Not on file    Physically abused: Not on file    Forced sexual activity: Not on file  Other Topics Concern   Not on file  Social History Narrative   Not on file     Family His father had a single kidney from birth, eventually died of renal and heart failure. No other known family history   Review of Systems: All other systems reviewed and are otherwise negative except as noted above.  Physical Exam: Vitals:   12/27/18 2012 12/28/18 0002 12/28/18 0408 12/28/18 0827  BP: (!) 157/76 (!) 153/82 (!) 151/72 (!) 170/76  Pulse: 86 90 87 91  Resp: 18 18 18 18   Temp: 99 F (37.2 C) 99 F (37.2 C) 98.6 F (37 C) 98.2 F (36.8 C)  TempSrc: Oral Oral Oral Oral  SpO2: 98% 99% 100% 99%  Weight:      Height:         Limited exam GEN- The patient is well appearing, alert and oriented x 3 today.   Head- normocephalic, atraumatic Eyes-  Sclera clear, conjunctiva pink Ears- hearing intact Neck- supple Lungs-  normal WOB Heart- not examined, echo and telemetry are reviewed GI- soft, NT, ND Extremities- no clubbing, cyanosis, or edema MS- no obvious deformites Skin- no rash or lesion Psych- euthymic mood, full affect   Labs:   Lab Results  Component Value Date   WBC 12.0 (H) 12/28/2018   HGB 10.4 (L) 12/28/2018   HCT 35.0 (L) 12/28/2018   MCV 84.5 12/28/2018   PLT 464 (H) 12/28/2018    Recent Labs  Lab 12/28/18 0355  NA 138  K 4.1  CL 104  CO2 22  BUN 16  CREATININE 1.06  CALCIUM 8.6*  GLUCOSE 119*   No results found for: CKTOTAL, CKMB, CKMBINDEX, TROPONINI Lab Results  Component Value Date   CHOL 135 12/27/2018   Lab Results  Component  Value Date   HDL 30 (L) 12/27/2018   Lab Results  Component Value Date   LDLCALC 91 12/27/2018   Lab Results  Component Value Date   TRIG 68 12/27/2018   Lab Results  Component Value Date   CHOLHDL 4.5 12/27/2018   No results found for: LDLDIRECT  No results found for: DDIMER   Radiology/Studies:   Ct Angio Head W Or Wo Contrast Result Date: 12/26/2018 CLINICAL DATA:  Abnormal CT. Blurred vision. Dizziness. Loss of peripheral vision. EXAM: CT ANGIOGRAPHY HEAD AND NECK TECHNIQUE: Multidetector CT imaging of the head and neck was performed using the standard protocol during bolus administration of intravenous contrast. Multiplanar CT image reconstructions and MIPs were obtained to evaluate the vascular anatomy. Carotid stenosis measurements (when applicable) are obtained utilizing NASCET criteria, using the distal internal carotid diameter as the denominator. CONTRAST:  140mL OMNIPAQUE IOHEXOL 350 MG/ML SOLN COMPARISON:  CT head earlier today at [facility]. FINDINGS: CT HEAD FINDINGS Brain: Acute appearing RIGHT PCA territory infarct, no definite hemorrhage, primarily affecting the posterior lobe and occipital lobe on the RIGHT. No other areas of concern for ischemia. No hemorrhage, mass lesion, hydrocephalus, or extra-axial fluid. Vascular: Reported separately. Skull: Normal. Negative for fracture or focal lesion. Sinuses: Imaged portions are clear. Orbits: No acute finding. Review of the MIP images confirms the above findings CTA NECK FINDINGS Aortic arch: Standard branching. Imaged portion shows no evidence of aneurysm or dissection. No significant stenosis of the major arch vessel origins. Aortic atherosclerosis. Right carotid system: Calcified and noncalcified stenosis proximal RIGHT internal carotid artery, approximate 60-70% stenosis, based on luminal measurements of 2.0/4.6 proximal/distal. This is borderline flow reducing. More distally, no dissection or fibromuscular change. Left  carotid system: No evidence of dissection, stenosis (50% or greater) or occlusion. Nonstenotic atheromatous change at the bifurcation, less severe than on the RIGHT. Vertebral arteries: RIGHT vertebral artery is patent throughout and dominant contributor to the basilar. 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 Muscular collaterals reconstitute V2 and V3. Vessel terminates in PICA. Skeleton: Spondylosis.  Patient is largely edentulous. Other neck: No masses.  Airway patent. Upper chest: Severe emphysematous change.  No pneumothorax. Review of the  MIP images confirms the above findings CTA HEAD FINDINGS Anterior circulation: 50% stenosis of the inferior cavernous ICA on the LEFT. Additional 50-75% stenosis of the supraclinoid ICA on the LEFT. ICA terminus widely patent. Nonstenotic atheromatous change of the cavernous and supraclinoid ICA on the RIGHT. ICA terminus widely patent. Anterior and middle cerebral arteries are free from proximal disease. Rudimentary RIGHT PCOM. Mild irregularity of the BILATERAL M3 MCA branches. Posterior circulation: RIGHT vertebral is the dominant/sole contributor to the basilar. The basilar is widely patent. The P1, P2, P3 segments of the posterior cerebral arteries bilaterally are mildly irregular, but patent without focal stenosis. No cerebellar branch occlusion. Venous sinuses: As permitted by contrast timing, patent. Anatomic variants: None of significance. Specifically no evidence of fetal RIGHT PCA. Delayed phase: No abnormal postcontrast enhancement. Well demarcated RIGHT PCA territory infarct. Review of the MIP images confirms the above findings IMPRESSION: Bland RIGHT PCA territory infarct without significant disease of the dominant RIGHT vertebral, basilar, or RIGHT PCA. No evidence of fetal PCA anatomy. Extracranial atheromatous change at the carotid bifurcations, not clearly flow reducing. 60-70% stenosis on the RIGHT, less than 50% stenosis on the LEFT. Non dominant  LEFT vertebral terminates in PICA. Muscular collaterals reconstitute the LEFT vertebral in the neck. Aortic Atherosclerosis (ICD10-I70.0) and Emphysema (ICD10-J43.9). Electronically Signed   By: Staci Righter M.D.   On: 12/26/2018 21:07     Mr Brain Wo Contrast Result Date: 12/26/2018 CLINICAL DATA:  Blurred vision for 1 week. EXAM: MRI HEAD WITHOUT CONTRAST TECHNIQUE: Multiplanar, multiecho pulse sequences of the brain and surrounding structures were obtained without intravenous contrast. COMPARISON:  Head CT 12/26/2018 FINDINGS: BRAIN: Intermediate sized right PCA territory infarct. Moderate cytotoxic edema without significant mass effect. There is an old infarct of the right pons. The midline structures are normal. White matter signal is normal outside of the infarcted area. The cerebral and cerebellar volume are age-appropriate. No hydrocephalus. Susceptibility-sensitive sequences show no chronic microhemorrhage or superficial siderosis. No mass lesion. VASCULAR: The major intracranial arterial and venous sinus flow voids are normal. SKULL AND UPPER CERVICAL SPINE: Calvarial bone marrow signal is normal. There is no skull base mass. Visualized upper cervical spine and soft tissues are normal. SINUSES/ORBITS: No fluid levels or advanced mucosal thickening. No mastoid or middle ear effusion. The orbits are normal. 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 No hemorrhage or mass effect. Electronically Signed   By: Ulyses Jarred M.D.   On: 12/26/2018 22:57    Vas [place] Transcranial Doppler W Bubbles Result Date: 12/27/2018  Transcranial Doppler with Bubble Indications: Stroke. Performing Technologist: Abram Sander RVS  Examination Guidelines: A complete evaluation includes B-mode imaging, spectral Doppler, color Doppler, and power Doppler as needed of all accessible portions of each vessel. Bilateral testing is considered an integral part of a complete examination. Limited examinations  for reoccurring indications may be performed as noted.  Summary:  A vascular evaluation was performed. The right Carotid Siphon was studied. An IV was inserted into the patient's right Forearm. Verbal informed consent was obtained.  Interpretation: HITS heard at rest: None HITS heard during valsalva: None *See table(s) above for measurements and observations.    Preliminary      Vas [place] Lower Extremity Venous (dvt) Result Date: 12/27/2018  Lower Venous Study Indications: Stroke.  Performing Technologist: Abram Sander RVS  Examination Guidelines: A complete evaluation includes B-mode imaging, spectral Doppler, color Doppler, and power Doppler as needed of all accessible portions of each vessel. Bilateral testing is  considered an integral part of a complete examination. Limited examinations for reoccurring indications may be performed as noted.      Summary: Right: There is no evidence of deep vein thrombosis in the lower extremity. No cystic structure found in the popliteal fossa. Left: There is no evidence of deep vein thrombosis in the lower extremity. No cystic structure found in the popliteal fossa.  *See table(s) above for measurements and observations. Electronically signed by Servando Snare MD on 12/27/2018 at 5:09:22 PM.    Final     12-lead ECG no EKGs in Epic All prior EKG's in EPIC reviewed with no documented atrial fibrillation  Telemetry SR  Assessment and Plan:  1. Cryptogenic stroke The patient presents with cryptogenic stroke.  I spoke at length with the patient regarding AFib,, stroke, and rational behind monitoring.  We discussed 30 day event monitor or an implantable loop recorder.  Risks, benefits, and alteratives to implantable loop recorder were discussed with the patient today.   At this time, the patient would like to first consider wearing a 30 day monitor and proceed with loop if no arrhythmia is found.  He looks forward to discussing further with Dr. Curt Bears for further  discussion and final decision    Laurajean Hosek Meredith Leeds, MD 12/28/2018   I have seen and examined this patient with Tommye Standard.  Agree with above, note added to reflect my findings.  On exam, no edema, NAD, normal work of breathing.  Patient admitted for cryptogenic stroke.  He is improving.  No cause has since been found.  I discussed with him the possibility of a Linq monitor implantation.  At this point, he wishes to have a 30-day monitor prior to Linq monitor implant.  If his 30-day monitor is unrevealing, we Sharunda Salmon plan for Linq monitor implantation at that time..    Atira Borello M. Tyreque Finken MD 12/28/2018 8:31 AM

## 2018-12-28 NOTE — Progress Notes (Signed)
Pt d/c home no new concerns, pt is stable with no new concerns. Pt will be transported out of the hospital by family

## 2018-12-28 NOTE — Progress Notes (Addendum)
STROKE TEAM PROGRESS NOTE   INTERVAL HISTORY Pt sitting in bed, AAOO x3. 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 He says he is ready for d/c today. No complaints  Vitals:   12/28/18 0002 12/28/18 0408 12/28/18 0827 12/28/18 1223  BP: (!) 153/82 (!) 151/72 (!) 170/76 (!) 164/86  Pulse: 90 87 91 83  Resp: _0 Temp: 99 F (37.2 C) 98.6 F (37 C) 98.2 F (36.8 C) 98.3 F (36.8 C)  TempSrc: Oral Oral Oral Oral  SpO2: 99% 100% 99% 100%  Weight:      Height:        CBC:  Recent Labs  Lab 12/27/18 0405 12/28/18 0355  WBC 14.0* 12.0*  NEUTROABS  --  7.8*  HGB 10.0* 10.4*  HCT 32.0* 35.0*  MCV 83.1 84.5  PLT 410* 464*    Basic Metabolic Panel:  Recent Labs  Lab 12/27/18 0405 12/28/18 0355  NA 136 138  K 4.0 4.1  CL 104 104  CO2 22 22  GLUCOSE 108* 119*  BUN 13 16  CREATININE 1.12 1.06  CALCIUM 8.8* 8.6*   Lipid Panel:     Component Value Date/Time   CHOL 135 12/27/2018 0405   TRIG 68 12/27/2018 0405   HDL 30 (L) 12/27/2018 0405   CHOLHDL 4.5 12/27/2018 0405   VLDL 14 12/27/2018 0405   LDLCALC 91 12/27/2018 0405   HgbA1c:  Lab Results  Component Value Date   HGBA1C 5.4 12/27/2018   Urine Drug Screen: No results found for: LABOPIA, COCAINSCRNUR, LABBENZ, AMPHETMU, THCU, LABBARB  Alcohol Level No results found for: ETH  IMAGING Ct Angio Head W Or Wo Contrast  Result Date: 12/26/2018 CLINICAL DATA:  Abnormal CT. Blurred vision. Dizziness. Loss of peripheral vision. EXAM: CT ANGIOGRAPHY HEAD AND NECK TECHNIQUE: Multidetector CT imaging of the head and neck was performed using the standard protocol during bolus administration of intravenous contrast. Multiplanar CT image reconstructions and MIPs were obtained to evaluate the vascular anatomy. Carotid stenosis measurements (when applicable) are obtained utilizing NASCET criteria, using the distal internal carotid diameter as the denominator. CONTRAST:  177m OMNIPAQUE IOHEXOL 350 MG/ML SOLN COMPARISON:  [facility] CT HEAD FINDINGS Brain: Acute appearing RIGHT PCA territory infarct, no definite hemorrhage, primarily affecting the posterior lobe and occipital lobe on the RIGHT. No other areas of concern for ischemia. No hemorrhage, mass lesion, hydrocephalus, or extra-axial fluid. Vascular: Reported separately. Skull: Normal. Negative for fracture or focal lesion. Sinuses: Imaged portions are clear. Orbits: No acute finding. Review of the MIP images confirms the above findings CTA NECK FINDINGS Aortic arch: Standard branching. Imaged portion shows no evidence of aneurysm or dissection. No significant stenosis of the major arch vessel origins. Aortic atherosclerosis. Right carotid system: Calcified and noncalcified stenosis proximal RIGHT internal carotid artery, approximate 60-70% stenosis, based on luminal measurements of 2.0/4.6 proximal/distal. This is borderline flow reducing. More distally, no dissection or fibromuscular change. Left carotid system: No evidence of dissection, stenosis (50% or greater) or occlusion. Nonstenotic atheromatous change at the bifurcation, less severe than on the RIGHT. Vertebral arteries: RIGHT vertebral artery is patent throughout and dominant contributor to the basilar. 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 Muscular collaterals reconstitute V2 and V3. Vessel terminates in PICA. Skeleton: Spondylosis.  Patient is largely edentulous. Other neck: No masses.  Airway patent. Upper chest: Severe emphysematous change.  No pneumothorax. Review of the MIP images confirms the above findings CTA HEAD FINDINGS  Anterior circulation: 50% stenosis of the inferior cavernous ICA on the LEFT. Additional 50-75% stenosis of the supraclinoid ICA on the LEFT. ICA terminus widely patent. Nonstenotic atheromatous change of the cavernous and supraclinoid ICA on the RIGHT. ICA terminus widely patent. Anterior and middle cerebral arteries are free from proximal  disease. Rudimentary RIGHT PCOM. Mild irregularity of the BILATERAL M3 MCA branches. Posterior circulation: RIGHT vertebral is the dominant/sole contributor to the basilar. The basilar is widely patent. The P1, P2, P3 segments of the posterior cerebral arteries bilaterally are mildly irregular, but patent without focal stenosis. No cerebellar branch occlusion. Venous sinuses: As permitted by contrast timing, patent. Anatomic variants: None of significance. Specifically no evidence of fetal RIGHT PCA. Delayed phase: No abnormal postcontrast enhancement. Well demarcated RIGHT PCA territory infarct. Review of the MIP images confirms the above findings IMPRESSION: Bland RIGHT PCA territory infarct without significant disease of the dominant RIGHT vertebral, basilar, or RIGHT PCA. No evidence of fetal PCA anatomy. Extracranial atheromatous change at the carotid bifurcations, not clearly flow reducing. 60-70% stenosis on the RIGHT, less than 50% stenosis on the LEFT. Non dominant LEFT vertebral terminates in PICA. Muscular collaterals reconstitute the LEFT vertebral in the neck. Aortic Atherosclerosis (ICD10-I70.0) and Emphysema (ICD10-J43.9). Electronically Signed   By: Staci Righter M.D.   On: 12/26/2018 21:07   Ct Angio Neck W Or Wo Contrast  Result Date: 12/26/2018 CLINICAL DATA:  Abnormal CT. Blurred vision. Dizziness. Loss of peripheral vision. EXAM: CT ANGIOGRAPHY HEAD AND NECK TECHNIQUE: Multidetector CT imaging of the head and neck was performed using the standard protocol during bolus administration of intravenous contrast. Multiplanar CT image reconstructions and MIPs were obtained to evaluate the vascular anatomy. Carotid stenosis measurements (when applicable) are obtained utilizing NASCET criteria, using the distal internal carotid diameter as the denominator. CONTRAST:  161m OMNIPAQUE IOHEXOL 350 MG/ML SOLN COMPARISON[facility]  [facility]IND[facility]: CT HEAD FINDINGS Brain: Acute  appearing RIGHT PCA territory infarct, no definite hemorrhage, primarily affecting the posterior lobe and occipital lobe on the RIGHT. No other areas of concern for ischemia. No hemorrhage, mass lesion, hydrocephalus, or extra-axial fluid. Vascular: Reported separately. Skull: Normal. Negative for fracture or focal lesion. Sinuses: Imaged portions are clear. Orbits: No acute finding. Review of the MIP images confirms the above findings CTA NECK FINDINGS Aortic arch: Standard branching. Imaged portion shows no evidence of aneurysm or dissection. No significant stenosis of the major arch vessel origins. Aortic atherosclerosis. Right carotid system: Calcified and noncalcified stenosis proximal RIGHT internal carotid artery, approximate 60-70% stenosis, based on luminal measurements of 2.0/4.6 proximal/distal. This is borderline flow reducing. More distally, no dissection or fibromuscular change. Left carotid system: No evidence of dissection, stenosis (50% or greater) or occlusion. Nonstenotic atheromatous change at the bifurcation, less severe than on the RIGHT. Vertebral arteries: RIGHT vertebral artery is patent throughout and dominant contributor to the basilar. 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 Muscular collaterals reconstitute V2 and V3. Vessel terminates in PICA. Skeleton: Spondylosis.  Patient is largely edentulous. Other neck: No masses.  Airway patent. Upper chest: Severe emphysematous change.  No pneumothorax. Review of the MIP images confirms the above findings CTA HEAD FINDINGS Anterior circulation: 50% stenosis of the inferior cavernous ICA on the LEFT. Additional 50-75% stenosis of the supraclinoid ICA on the LEFT. ICA terminus widely patent. Nonstenotic atheromatous change of the cavernous and supraclinoid ICA on the RIGHT. ICA terminus widely patent. Anterior and middle cerebral arteries are free from  proximal disease. Rudimentary RIGHT PCOM. Mild irregularity of the BILATERAL M3 MCA  branches. Posterior circulation: RIGHT vertebral is the dominant/sole contributor to the basilar. The basilar is widely patent. The P1, P2, P3 segments of the posterior cerebral arteries bilaterally are mildly irregular, but patent without focal stenosis. No cerebellar branch occlusion. Venous sinuses: As permitted by contrast timing, patent. Anatomic variants: None of significance. Specifically no evidence of fetal RIGHT PCA. Delayed phase: No abnormal postcontrast enhancement. Well demarcated RIGHT PCA territory infarct. Review of the MIP images confirms the above findings IMPRESSION: Bland RIGHT PCA territory infarct without significant disease of the dominant RIGHT vertebral, basilar, or RIGHT PCA. No evidence of fetal PCA anatomy. Extracranial atheromatous change at the carotid bifurcations, not clearly flow reducing. 60-70% stenosis on the RIGHT, less than 50% stenosis on the LEFT. Non dominant LEFT vertebral terminates in PICA. Muscular collaterals reconstitute the LEFT vertebral in the neck. Aortic Atherosclerosis (ICD10-I70.0) and Emphysema (ICD10-J43.9). Electronically Signed   By: Staci Righter M.D.   On: 12/26/2018 21:07   Mr Brain Wo Contrast  Result Date: 12/26/2018 CLINICAL DATA:  Blurred vision for 1 week. EXAM: MRI HEAD WITHOUT CONTRAST TECHNIQUE: Multiplanar, multiecho pulse sequences of the brain and surrounding structures were obtained without intravenous contrast. COMPARISON:  Head CT 12/26/2018 FINDINGS: BRAIN: Intermediate sized right PCA territory infarct. Moderate cytotoxic edema without significant mass effect. There is an old infarct of the right pons. The midline structures are normal. White matter signal is normal outside of the infarcted area. The cerebral and cerebellar volume are age-appropriate. No hydrocephalus. Susceptibility-sensitive sequences show no chronic microhemorrhage or superficial siderosis. No mass lesion. VASCULAR: The major intracranial arterial and venous sinus  flow voids are normal. SKULL AND UPPER CERVICAL SPINE: Calvarial bone marrow signal is normal. There is no skull base mass. Visualized upper cervical spine and soft tissues are normal. SINUSES/ORBITS: No fluid levels or advanced mucosal thickening. No mastoid or middle ear effusion. The orbits are normal. 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 No hemorrhage or mass effect. Electronically Signed   By: Ulyses Jarred M.D.   On: 12/26/2018 22:57   Vas [place] Transcranial Doppler W Bubbles  Result Date: 12/27/2018  Transcranial Doppler with Bubble Indications: Stroke. Performing Technologist: Abram Sander RVS  Examination Guidelines: A complete evaluation includes B-mode imaging, spectral Doppler, color Doppler, and power Doppler as needed of all accessible portions of each vessel. Bilateral testing is considered an integral part of a complete examination. Limited examinations for reoccurring indications may be performed as noted.  Summary:  A vascular evaluation was performed. The right Carotid Siphon was studied. An IV was inserted into the patient's right Forearm. Verbal informed consent was obtained.  Interpretation: HITS heard at rest: None HITS heard during valsalva: None *See table(s) above for measurements and observations.    Preliminary    Vas [place] Lower Extremity Venous (dvt)  Result Date: 12/27/2018  Lower Venous Study Indications: Stroke.  Performing Technologist: Abram Sander RVS  Examination Guidelines: A complete evaluation includes B-mode imaging, spectral Doppler, color Doppler, and power Doppler as needed of all accessible portions of each vessel. Bilateral testing is considered an integral part of a complete examination. Limited examinations for reoccurring indications may be performed as noted.  +---------+---------------+---------+-----------+----------+-------+ RIGHT    CompressibilityPhasicitySpontaneityPropertiesSummary  +---------+---------------+---------+-----------+----------+-------+ CFV      Full           Yes      Yes                          +---------+---------------+---------+-----------+----------+-------+  SFJ      Full                                                 +---------+---------------+---------+-----------+----------+-------+ FV Prox  Full                                                 +---------+---------------+---------+-----------+----------+-------+ FV Mid   Full                                                 +---------+---------------+---------+-----------+----------+-------+ FV DistalFull                                                 +---------+---------------+---------+-----------+----------+-------+ PFV      Full                                                 +---------+---------------+---------+-----------+----------+-------+ POP      Full           Yes      Yes                          +---------+---------------+---------+-----------+----------+-------+ PTV      Full                                                 +---------+---------------+---------+-----------+----------+-------+ PERO     Full                                                 +---------+---------------+---------+-----------+----------+-------+   +---------+---------------+---------+-----------+----------+-------+ LEFT     CompressibilityPhasicitySpontaneityPropertiesSummary +---------+---------------+---------+-----------+----------+-------+ CFV      Full           Yes      Yes                          +---------+---------------+---------+-----------+----------+-------+ SFJ      Full                                                 +---------+---------------+---------+-----------+----------+-------+ FV Prox  Full                                                 +---------+---------------+---------+-----------+----------+-------+  FV Mid   Full                                                  +---------+---------------+---------+-----------+----------+-------+ FV DistalFull                                                 +---------+---------------+---------+-----------+----------+-------+ PFV      Full                                                 +---------+---------------+---------+-----------+----------+-------+ POP      Full           Yes      Yes                          +---------+---------------+---------+-----------+----------+-------+ PTV      Full                                                 +---------+---------------+---------+-----------+----------+-------+ PERO     Full                                                 +---------+---------------+---------+-----------+----------+-------+     Summary: Right: There is no evidence of deep vein thrombosis in the lower extremity. No cystic structure found in the popliteal fossa. Left: There is no evidence of deep vein thrombosis in the lower extremity. No cystic structure found in the popliteal fossa.  *See table(s) above for measurements and observations. Electronically signed by Servando Snare MD on 12/27/2018 at 5:09:22 PM.    Final    2D Echocardiogram   1. The left ventricle has normal systolic function with an ejection fraction of 60-65%. The cavity size was normal. There is severely increased left ventricular wall thickness. Left ventricular diastolic parameters were normal.  2. The right ventricle has normal systolic function. The cavity was normal. There is no increase in right ventricular wall thickness.  3. Negative bubble study for right to left shunting.  4. Mild thickening of the mitral valve leaflet. Mild calcification of the mitral valve leaflet.  5. The aortic valve is tricuspid. Mild thickening of the aortic valve. Mild calcification of the aortic valve.  6. Mild pulmonic stenosis.   PHYSICAL EXAM  Temp:  [98.2 F (36.8 C)-99.2  F (37.3 C)] 98.3 F (36.8 C) (04/24 1223) Pulse Rate:  [83-95] 83 (04/24 1223) Resp:  [16-18] 16 (04/24 1223) BP: (144-170)/(72-86) 164/86 (04/24 1223) SpO2:  [98 %-100 %] 100 % (04/24 1223)  General - Well nourished, well developed, in no apparent distress.  Ophthalmologic - fundi not visualized due to noncooperation.  Cardiovascular - Regular rate and rhythm.  Mental Status -  Level of arousal and orientation to time, place, and person were intact. Language including expression,  naming, repetition, comprehension was assessed and found intact. Attention span and concentration were normal. Fund of Knowledge was assessed and was intact.  Cranial Nerves II - XII - II - left lower quadrantanopia. III, IV, VI - Extraocular movements intact. V - Facial sensation intact bilaterally. VII - Facial movement intact bilaterally. VIII - Hearing & vestibular intact bilaterally. X - Palate elevates symmetrically. XI - Chin turning & shoulder shrug intact bilaterally. XII - Tongue protrusion intact.  Motor Strength - The patient's strength was normal in all extremities and pronator drift was absent.  Bulk was normal and fasciculations were absent.   Motor Tone - Muscle tone was assessed at the neck and appendages and was normal.  Reflexes - The patient's reflexes were symmetrical in all extremities and he had no pathological reflexes.  Sensory - Light touch, temperature/pinprick were assessed and were symmetrical.    Coordination - The patient had normal movements in the hands and feet with no ataxia or dysmetria.  Tremor was absent.  Gait and Station - deferred.   ASSESSMENT/PLAN Mr. LELAND RAVER is a 58 y.o. male with history of HTN presenting to [facility] 4/[facility] w/ peripheral vision loss since wakingThurs 4/16 along w/ severe HA. As OP, found to have R ICA 90% stenosis and started on abx for sinus infection. CT at [facility] showed subacute R PCA infarct. Transferred to [facility]  for neuro and vascular consult.   Stroke:  right PCA infarct, embolic pattern, secondary to unknown source, cardioembolic vs. Large vessel athero  CT head [place]) subacute R PCA  CTA head & neck bland R PCA infarct. No posterior circulation dz noted. Non-dominant L VA ends in PICA. R ICA 60-70%, L ICA < 50%, both with soft plaques. B/l siphon athero, left ICA distal tandem stenosis. Aortic atherosclerosis. Emphysema.   MRI  R PCA infarct  TCD Doppler w/ bubbles - no PFO at rest (difficult window and pt moving on valsalva)  LE doppler - No DVT found  Recommend loop recorder to rule out afib, but patient optioned surgery CardioNet monitoring; appreciate Cardiology consult  2D Echo EF 60-65%. No source of embolus. Neg bubble  LDL 91  HgbA1c 5.4  Hypercoagulable work up negative except slightly elevated homocystine and ESR/CRP, ANA still pending  Lovenox 40 mg sq daily for VTE prophylaxis  aspirin 325 mg daily prior to admission, now on aspirin 81 mg daily and clopidogrel 75 mg daily. Continue DAPT x 3 weeks then plavix alone.   Therapy recommendations:  pending   Disposition:  pending   Pt still has left lower quadrantanopia, recommend ophthalmology follow up ASAP to clear for driving privilege. No driving until then. Pt was educated on this.  Cerebral vascular atherosclerosis  CTA head and neck - R ICA 60-70%, L ICA < 50%, both with soft plaques. B/l siphon athero, left ICA distal tandem stenosis.   OSH CUS reported to showed right ICA 90% stenosis  On DAPT now  Recommend outpt vascular surgery follow up (Dr. Charlesetta Ivory)  Hypertension  Stable . Permissive hypertension (OK if < 220/120) but gradually normalize in 2-3 days . Long-term BP goal normotensive  Hyperlipidemia  Home meds:  No statin  Now on lipitor 40  LDL 91, goal < 70  Continue statin at discharge  Tobacco abuse  Current smoker  Smoking cessation counseling provided  Nicotine patch  provided  Pt is willing to quit  Other Stroke Risk Factors  Overweight, Body mass index is 32.32 kg/m., recommend weight  loss, diet and exercise as appropriate   Other Active Problems  Leukocytosis 14, on abx PTA for sinus infection  normocytic anemia 10. New iron  Stroke education done on secondary prevention  No further inpt wk up or recommendations at this time. We will s/o  Hospital day # 2  [place], ARNP-C, ANVP-BC Stroke Neurology 12/28/2018 12:47 PM   ATTENDING NOTE: I reviewed above note and agree with the assessment and plan.   Patient neuro stable, still has left lower quadrantanopsia.  Plan for loop recorder, however when EP discussed with patient patient option 30-day CardioNet monitoring as outpatient.  TCD bubble study no PFO at rest, however, patient has no trans-temporal window, has to do ophthalmic window which also suboptimal and the patient moving during Valsalva making test failure during Valsalva maneuver.  DVT negative.  Continue DAPT and statin on discharge.  Follow-up with stroke clinic in [place] in 4 weeks.  Rosalin Hawking, MD PhD Stroke Neurology 12/28/2018 3:53 PM     To contact Stroke Continuity provider, please refer to [email or web address] After hours, contact General Neurology

## 2018-12-31 ENCOUNTER — Other Ambulatory Visit: Payer: Self-pay

## 2018-12-31 NOTE — Patient Outreach (Signed)
[facility]) Care Management  12/31/2018  Marc Terry [birth year] [id number]  EMMI: stroke red alert Referral date: 12/31/18 Referral reason: feeling worse overall: yes,   Questions/ problems with medications: yes Insurance: none listed Day # 1  ATTEMPT #1  Telephone call to patient regarding EMMI stroke red alert. Unable to reach patient. HIPAA compliant voice mail message left with call back phone number.   PLAN: RNCM will attempt 2nd telephone call to patient within 4 business days.   [name] RN,BSN,CCM [facility] Telephonic  [phone]

## 2019-01-02 ENCOUNTER — Telehealth: Payer: Self-pay | Admitting: Radiology

## 2019-01-02 ENCOUNTER — Other Ambulatory Visit: Payer: Self-pay

## 2019-01-02 NOTE — Telephone Encounter (Signed)
Enrolled patient for a 30 day Event Monitor to be mailed due to Covid-19. Brief instructions were gone over with patient and he knows to expect the monitor to arrive in 3-4 days. Also patient is self pay/medicaid potential I explained when [employer] calls him to please speak to them about there hardship program.

## 2019-01-02 NOTE — Patient Outreach (Signed)
[name]  01/02/2019  Marc Terry 08/30/1961 [id number]  EMMI: stroke red alert Referral date: 12/31/18 Referral reason: feeling worse overall: yes,   Questions/ problems with medications: yes Insurance: none listed Day # 1  Telephone call to patient regarding EMMI stroke red alert. HIPAA verified. RNCM introduced herself and explained reason for call. Patient states he is not feeling worse or having issues with his medications. He states he does not think the automated service recorded his answers correctly.  Patient states he is not having any issues. He states he feels he is getting his strength back. Patient states the only area effected by the stroke is his peripheral vision. Patient reports he will follow up with an ophthalmologist within a couple of weeks. Patient states he is being sent a heart monitor that he will wear for 30 days. Patient states he is taking his medications as prescribed. He reports his wife will assist his with transportation when needed.  RNCM discussed signs/ symptoms of stroke with patient. Advised patient that 911 should be call for stroke like symptoms. Patient verbally agreed to  [facility] discussed COVID 19 precautions and symptoms. Advised patient to contact her doctor for minor symptoms. If symptoms more severe call 911.  Patient verbalized understanding.  RNCM advised patient to notify MD of any changes in condition prior to scheduled appointment. RNCM provided contact name and number:24 hour nurse advise line [phone].  Advised patient that they would continue to get automated EMMI-Stroke post discharge calls to assess how they are doing and will receive a call from a nurse if any of their responses were abnormal.  Patient verbalized understanding and appreciation of call.   PLAN: RNCM will close patient due to being assessed and having no further needs.   [name] RN,BSN,CCM [facility] Telephonic   [phone]

## 2019-01-08 ENCOUNTER — Other Ambulatory Visit: Payer: Self-pay

## 2019-01-08 NOTE — Patient Outreach (Signed)
[facility] [facility]) Care Management  01/08/2019  Marc Terry 04-01-1961 [id number]  EMMI:stroke red alert Referral date:12/31/18 Referral reason: Sad / hopeless / anxious / empty: yes  Insurance: none listed Day #1  Telephone call to patient regarding EMMI stroke red alert. HIPAA verified with patient. Explained reason for call. Patient states he is not feeling sad or hopeless. Patient states he is feeling apprehensive.  He states there are times he is worried about whether the stroke will happen again and also concerns about managing his vision issue that was caused by the stroke.  Patient states there are things he wants to do but he is unable to right now. He reports he is not as tired as he was after initially coming home from the hospital.  Patient states he is taking his medications as prescribed and following up with his doctors as requested. Patient states he has a follow up appointment with the eye doctor in 1-2 weeks. Patient states he has felt better emotionally over the last couple of days.   RNCM gave patient verbal encouragement.  Discussed and offered follow up with [facility] care management social worker for anxiety/ depression symptoms. Patient declined stating he is managing.   Patient states he has not received the heart monitor in the mail. He states he has not heard from anyone from the cardiology office.  RNCM offered to contact the cardiology office for patient to follow up on heart/ monitor.  RNCM contacted [place] heart[facility] office.  RNCM spoke with the nurse who states patients heart monitor was mailed to him on 01/04/19.   RNCM contacted patient to inform him of heart monitor being mailed on 01/04/19. Advised patient to follow up with cardiology office if monitor is not received within 1 week.  Patient verbalized understanding and appreciation for assisting. RNCM provided contact phone number and address to patient for [facility] heart care and Dr. Ocie [facility] office.  Patient denies any further needs or concerns at this time. RNCM advised patient to contact her if additional assistance is needed.  RNCM advised patient to notify MD of any changes in condition prior to scheduled appointment. RNCM provided contact name and number: [phone] or main office number [phone] and 24 hour nurse advise line [phone].  RNCM verified patient aware of 911 services for urgent/ emergent needs. RNCM discussed COVID 19 precautions and symptoms. Advised patient to contact her doctor for minor symptoms. If symptoms more severe call 911.  Patient verbalized understanding.   PLAN; RNCM will close due to patient being assessed and having no further needs.   [name] RN,BSN,CCM [facility] Telephonic  [phone]

## 2019-01-14 ENCOUNTER — Other Ambulatory Visit: Payer: Self-pay

## 2019-01-14 NOTE — Patient Outreach (Signed)
[name] Care Management  01/14/2019  Marc Terry 07-11-1961 [id number]  EMMI: stroke red alert Referral date: 01/14/19 Referral reason:  Went to follow up appointment: no,   Sad, hopeless, anxious, empty: Yes Insurance: Medicaid Potential Day # 9 and #13  ATTEMPT #1  Telephone call to patient regarding EMMI stroke red alert. Unable to reach patient. HIPAA compliant voice message left with call back phone number.  PLAN:  RNCM will attempt 2nd telephone call to patient within 4 business days.   [name] RN,BSN,CCM [name] Telephonic  [phone]

## 2019-01-15 ENCOUNTER — Other Ambulatory Visit: Payer: Self-pay

## 2019-01-15 NOTE — Patient Outreach (Signed)
[place] [facility]) Care Management  01/15/2019  Marc Terry [birth year] [id number]  EMMI: stroke red alert Referral date: 01/14/19 Referral reason:  Went to follow up appointment: no,   Sad, hopeless, anxious, empty: Yes Insurance: Medicaid Potential Day # 9 and #13  Telephone call to patient regarding EMMI stroke red alert. HIPAA verified with patient. Patient states he has a follow up appointment scheduled with his primary MD on tomorrow 01/16/19.   Patient states he is still feeling a little sad a times but managing. Patient states this feeling is due to his current situation with managing his recent medical condition. Patient states he has to wear sun glasses when he goes out to help with his eye. He states he forgot to take them with him one day and had some symptoms. He states he had to buy new sunglasses when he was out that day. RNCM advised patient to leave a pair of  Nancy Fetter glasses in his car for access. Patient verbally agreed. Patient reports he has not received his heart monitor.  RNCM advised patient she will follow up with [facility] heart care.  RNCM contacted [facility] heart care and spoke with A[place] regarding status of patients heart monitor. RNCM explained that patient has not received the heart monitor that was ordered.  Avaletta states [employer] had attempted contact with patient but was unable to reach and patient's insurance needed to be verified. Patient's contact phone number was verified by Avaletta with RNCM. Avaletta informed RNCM that [employer] would attempt contact with patient today. RNCM requested contact phone number to [employer] and informed Avaletta that she would notify the patient of this information.  RNCM contacted patient. HIPAA verified. Notified patient that [place] would attempt contact with him today regarding his heart monitor.  RNCM provided patient with contact phone number to [employer] and advised patient if he does not hear from company  by close of business today to contact them.  Patient verbalized understanding and appreciation for the assistance.  RNCM informed patient she would follow up with him within 2 business days regarding the heart monitor status. Patient verbally agreed.   PLAN; RNCM will follow up with patient within 2 business days.   [name] RN,BSN,CCM [facility] Telephonic  [phone]

## 2019-01-17 ENCOUNTER — Other Ambulatory Visit: Payer: Self-pay

## 2019-01-17 NOTE — Patient Outreach (Signed)
[name]  01/17/2019  Marc Terry February 24, 1961 [id number]  EMMI:stroke red alert Referral date:01/14/19 Referral reason:Went to follow up appointment: no, Sad, hopeless, anxious, empty: Yes Insurance: Medicaid Potential Day #9 and #13  Telephone call to patient regarding EMMI stroke follow up. Unable to reach patient. HIPAA compliant voice message left with call back phone number.  PLAN; RNCM will attempt 2nd telephone call to patient within 4 business days. Lequita Halt RN,BSN,CCM [facility] Telephonic  [phone]

## 2019-01-21 ENCOUNTER — Encounter: Payer: Self-pay | Admitting: Adult Health

## 2019-01-21 ENCOUNTER — Ambulatory Visit (INDEPENDENT_AMBULATORY_CARE_PROVIDER_SITE_OTHER): Payer: Self-pay | Admitting: Adult Health

## 2019-01-21 ENCOUNTER — Other Ambulatory Visit: Payer: Self-pay

## 2019-01-21 DIAGNOSIS — H538 Other visual disturbances: Secondary | ICD-10-CM

## 2019-01-21 DIAGNOSIS — I10 Essential (primary) hypertension: Secondary | ICD-10-CM

## 2019-01-21 DIAGNOSIS — E785 Hyperlipidemia, unspecified: Secondary | ICD-10-CM

## 2019-01-21 DIAGNOSIS — I63532 Cerebral infarction due to unspecified occlusion or stenosis of left posterior cerebral artery: Secondary | ICD-10-CM

## 2019-01-21 DIAGNOSIS — Z720 Tobacco use: Secondary | ICD-10-CM

## 2019-01-21 NOTE — Progress Notes (Signed)
I agree with the above plan 

## 2019-01-21 NOTE — Progress Notes (Signed)
[facility] [address] [place] [address] [phone]       VIRTUAL VISIT FOLLOW UP NOTE  [name] Date of Birth:  [birth year] Medical Record Number:  [id number]   Reason for Referral:  hospital stroke follow up    Virtual Visit via Video Note  I connected with Marc Terry on 01/21/19 at 10:15 AM EDT by a video enabled telemedicine application located remotely in my own home and verified that I am speaking with the correct person using two identifiers who was located at their own home.   Discussed the limitations of evaluation and management by telemedicine and the availability of in person appointments. The patient expressed understanding and agreed to proceed.Please see telephone note for additional scheduling information and consent.    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 History obtained from patient and chart review. Reviewed all radiology images and labs personally.   [name] is a 58 y.o. male with history of HTN who presented to [facility] 4/22 w/ peripheral vision loss since wakingThurs 4/16 along w/ severe HA. As OP, found to have R ICA 90% stenosis and started on abx for sinus infection. CT at [facility] showed subacute R PCA infarct. Transferred to [facility] for neuro and vascular consult.  CTA head and neck showed nondominant L VA ends in PICA, right ICA 60 to 70% stenosis, left ICA less than 50% stenosis both with soft plaques, bilateral siphon arthrosclerosis and left ICA distal tandem stenosis.  MRI showed right PCA infarct.  2D echo unremarkable.  TCD  Doppler with bubbles negative for PFO and lower extremity venous Dopplers negative for DVT.  Recommended loop recorder to rule out A. fib but patient but patient requested 30-day cardiac event monitor and referred to cardiology outpatient.  LDL 91 and A1c 5.4.  HTN stable.  Hyper coag work-up slightly elevated homocysteine and ESR/CRP.  Referred to outpatient vascular surgery for cerebrovascular atherosclerosis.  Recommended DAPT for 3 weeks then Plavix alone along with atorvastatin 40 mg daily.  Current tobacco use with smoking cessation counseling provided.  Residual deficits of left lower quadrantanopia and recommended evaluation by ophthalmology to clear for driving.  He has been doing well since hospital discharge with residual deficits of blurred vision L>R in his periphery and denies improvement.  He does endorse slight improvement with decreased lights and worsening with bright lights.  He does have visit scheduled with ophthalmology this week.  Recommended no driving until ophthalmology follow-up but did admit to occasional driving with only short distances.  He received his 30-day cardiac event monitor on Friday in the mail but as he was out of town this weekend, he plans on starting it this evening when he returns home.  Completed 3 weeks DAPT and continues on Plavix alone without side effects of bleeding or bruising.  Continues on atorvastatin without side effects myalgias.  Blood pressure stable.  Continues tobacco use but has been decreasing and plans on completely quitting.  No further concerns at this time.  Denies new or worsening stroke/TIA symptoms.    ROS:   14 system review of systems performed and negative with exception of  blurred vision  PMH:  Past Medical History:  Diagnosis Date   Hypertension     PSH:  Past Surgical History:  Procedure Laterality Date   CHOLECYSTECTOMY      Social History:  Social History   Socioeconomic History   Marital status: Married     Spouse name: Not on file   Number of children: Not on file   Years of education: Not on file   Highest education level: Not on file  Occupational History   Not on file  Social Needs   Financial resource strain: Not on file   Food insecurity:    Worry: Not on file    Inability: Not on file   Transportation needs:    Medical: Not on file    Non-medical: Not on file  Tobacco Use   Smoking status: Current Every Day Smoker    Packs/day: 2.00    Years: 20.00    Pack years: 40.00    Types: Cigarettes  Substance and Sexual Activity   Alcohol use: Never    Frequency: Never   Drug use: Never   Sexual activity: Not on file  Lifestyle   Physical activity:    Days per week: Not on file    Minutes per session: Not on file   Stress: Not on file  Relationships   Social connections:    Talks on phone: Not on file    Gets together: Not on file    Attends religious service: Not on file    Active member of club or organization: Not on file    Attends meetings of clubs or organizations: Not on file    Relationship status: Not on file   Intimate partner violence:    Fear of current or ex partner: Not on file    Emotionally abused: Not on file    Physically abused: Not on file    Forced sexual activity: Not on file  Other Topics Concern   Not on file  Social History Narrative   Not on file    Family History:  Family History  Problem Relation Age of Onset   Diabetes Mother    Hypertension Father     Medications:   Current Outpatient Medications on File Prior to Visit  Medication Sig Dispense Refill   amoxicillin (AMOXIL) 875 MG tablet Take 875 mg by mouth 2 (two) times a day. For 14 days     atorvastatin (LIPITOR) 40 MG tablet Take 1 tablet (40 mg total) by mouth daily at 6 PM. 30 tablet 1   clopidogrel (PLAVIX) 75 MG tablet Take 1 tablet (75 mg total) by mouth daily. 30 tablet 2   ferrous sulfate 325 (65 FE) MG tablet Take 1 tablet (325 mg total) by mouth  daily with breakfast. 30 tablet 3   loratadine (CLARITIN) 10 MG tablet Take 10 mg by mouth daily as needed for allergies or rhinitis.      traMADol (ULTRAM) 50 MG tablet Take 50 mg by mouth every 6 (six) hours as needed for pain.     valsartan-hydrochlorothiazide (DIOVAN-HCT) 160-12.5 MG tablet Take 1 tablet by mouth daily.     No current facility-administered medications on file prior to visit.     Allergies:  No Known Allergies   Physical Exam  Depression screen PHQ 2/9 01/21/2019  Decreased Interest 0  Down, Depressed, Hopeless 0  PHQ - 2 Score 0     General: well developed, well nourished, pleasant middle-aged Caucasian male, seated, in no  evident distress Head: head normocephalic and atraumatic.     Neurologic Exam Mental Status: Awake and fully alert. Oriented to place and time. Recent and remote memory intact. Attention span, concentration and fund of knowledge appropriate. Mood and affect appropriate.  Cranial Nerves: Extraocular movements full without nystagmus. Hearing intact to voice. Facial sensation intact. Face, tongue, palate moves normally and symmetrically.  Shoulder shrug symmetric. Motor: No evidence of weakness per drift assessment. Sensory.: intact to light touch Coordination: Rapid alternating movements normal in all extremities. Finger-to-nose and heel-to-shin performed accurately bilaterally. Gait and Station: Arises from chair without difficulty. Stance is normal. Gait demonstrates normal stride length and balance. Able to heel, toe and tandem walk without difficulty.  Reflexes: UTA   NIHSS  0 Modified Rankin  2    Diagnostic Data (Labs, Imaging, Testing)  CT ANGIO HEAD W OR WO CONTRAST CT ANGIO NECK W OR WO CONTRAST 12/26/2018 IMPRESSION: Bland RIGHT PCA territory infarct without significant disease of the dominant RIGHT vertebral, basilar, or RIGHT PCA. No evidence of fetal PCA anatomy. Extracranial atheromatous change at the carotid  bifurcations, not clearly flow reducing. 60-70% stenosis on the RIGHT, less than 50% stenosis on the LEFT. Non dominant LEFT vertebral terminates in PICA. Muscular collaterals reconstitute the LEFT vertebral in the neck.  MR BRAN WO CONTRAST 12/26/2018 IMPRESSION: Intermediate sized right PCA territory acute infarct. No hemorrhage or mass effect.  ECHOCARDIOGRAM 12/27/2018 IMPRESSIONS  1. The left ventricle has normal systolic function with an ejection fraction of 60-65%. The cavity size was normal. There is severely increased left ventricular wall thickness. Left ventricular diastolic parameters were normal.  2. The right ventricle has normal systolic function. The cavity was normal. There is no increase in right ventricular wall thickness.  3. Negative bubble study for right to left shunting.  4. Mild thickening of the mitral valve leaflet. Mild calcification of the mitral valve leaflet.  5. The aortic valve is tricuspid. Mild thickening of the aortic valve. Mild calcification of the aortic valve.  6. Mild pulmonic stenosis.    ASSESSMENT: Marc Terry is a 58 y.o. year old male here with left PCA infarct embolic pattern on 3/81/8299 secondary to unknown source likely cardioembolic versus large vessel arthrosclerosis. Vascular risk factors include HTN, HLD, cerebrovascular arthrosclerosis and tobacco use.  Residual deficits of blurred peripheral vision    PLAN:  1. Left PCA infarct: Continue clopidogrel 75 mg daily  and atorvastatin for secondary stroke prevention. Maintain strict control of hypertension with blood pressure goal below 130/90, diabetes with hemoglobin A1c goal below 6.5% and cholesterol with LDL cholesterol (bad cholesterol) goal below 70 mg/dL.  I also advised the patient to eat a healthy diet with plenty of whole grains, cereals, fruits and vegetables, exercise regularly with at least 30 minutes of continuous activity daily and maintain ideal body weight.  We will  start 30-day cardiac event monitor today to assess for atrial fibrillation. 2. HTN: Advised to continue current treatment regimen.  Advised to continue to monitor at home along with continued follow-up with PCP for management 3. HLD: Advised to continue current treatment regimen along with continued follow-up with PCP for future prescribing and monitoring of lipid panel 4. Cerebrovascular arthrosclerosis: Completed DAPT and continues on Plavix and atorvastatin.  Recommended outpatient vascular surgery follow-up with Dr. Maylon Peppers. Early -appointment needs to be scheduled 5. Blurred vision: Residual deficit of PCA infarct.  Follow-up with ophthalmology as scheduled.  Advised no driving until released by ophthalmology 6. Tobacco use: Smoking  cessation counseling and education provided    Follow up in 3 months or call earlier if needed   Greater than 50% of time during this 45 minute non-face-to-face visit was spent on counseling, explanation of diagnosis of left PCA infarct, reviewing risk factor management of HTN, tobacco use, cerebrovascular atherosclerosis and HLD, planning of further management along with potential future management, and discussion with patient and family answering all questions.    Venancio Poisson, AGNP-BC  [facility] [address] [place], [place] [address]  Phone [phone] Fax [phone] Note: This document was prepared with digital dictation and possible smart phrase technology. Any transcriptional errors that result from this process are unintentional.

## 2019-01-22 ENCOUNTER — Ambulatory Visit (INDEPENDENT_AMBULATORY_CARE_PROVIDER_SITE_OTHER): Payer: Self-pay

## 2019-01-22 ENCOUNTER — Other Ambulatory Visit: Payer: Self-pay

## 2019-01-22 DIAGNOSIS — I639 Cerebral infarction, unspecified: Secondary | ICD-10-CM

## 2019-01-22 DIAGNOSIS — I4891 Unspecified atrial fibrillation: Secondary | ICD-10-CM

## 2019-01-22 NOTE — Patient Outreach (Signed)
[facility]) Care Management  01/22/2019  Marc Terry 11/13/1960 [id number]  EMMI:stroke red alert Referral date:01/14/19 Referral reason:Went to follow up appointment: no, Sad, hopeless, anxious, empty: Yes Insurance: Medicaid Potential Day #9 and #13  Telephone call to patient regarding EMMI stroke red alert. HIPAA verified with patient. Patient states his heart monitor was delivered on Friday 01/18/19. Patient states he was out of town until yesterday and will apply the monitor this evening. Patient states he received a call from [place] regarding his heart monitor and knows he can call them with any questions he may have regarding application.  Patient states he had a virtual appointment with the neurologist on yesterday and has an appointment with the eye doctor next week. Patient denies any further needs or concerns. Patient expressed appreciation to [facility] for her assistance.    PLAN: RNCM will close due to patient being assessed and having no further needs.   [name] RN,BSN,CCM [facility] Telephonic  [phone]

## 2019-02-14 ENCOUNTER — Telehealth: Payer: Self-pay | Admitting: *Deleted

## 2019-02-14 NOTE — Telephone Encounter (Signed)
   Primary Cardiologist: No primary care provider on file.   Pt contacted.  History and symptoms reviewed.  Pt will f/u with [facility] provider as scheduled.  Pt. advised that we are restricting visitors at this time and request that only patients present for check-in prior to their appointment.  All other visitors should remain in their car.  If necessary, only one visitor may come with the patient, into the building. For everyone's safety, all patients and visitor entering our practice area should expect to be screened again prior to entering our waiting area.  Marc Terry  02/14/2019 1:12 PM     COVID-19 Pre-Screening Questions:  . In the past 7 to 10 days have you had a cough,  shortness of breath, headache, congestion, fever (100 or greater) body aches, chills, sore throat, or sudden loss of taste or sense of smell?  no . Have you been around anyone with known Covid 19?  no . Have you been around anyone who is awaiting Covid 19 test results in the past 7 to 10 days?  No . Have you been around anyone who has been exposed to Covid 19, or has mentioned symptoms of Covid 19 within the past 7 to 10 days?  no  If you have any concerns/questions about symptoms patients report during screening (either on the phone or at threshold). Contact the provider seeing the patient or DOD for further guidance.  If neither are available contact a member of the leadership team.

## 2019-02-19 ENCOUNTER — Ambulatory Visit: Payer: Self-pay | Admitting: Cardiology

## 2019-02-25 ENCOUNTER — Other Ambulatory Visit: Payer: Self-pay

## 2019-03-01 ENCOUNTER — Telehealth: Payer: Self-pay

## 2019-03-01 NOTE — Telephone Encounter (Signed)
Notes recorded by Frederik Schmidt, RN on 03/01/2019 at 4:03 PM EDT  I tried to call the patient, but mailbox is full. 6/26  ------

## 2019-03-01 NOTE — Telephone Encounter (Signed)
-----   Message from Will Meredith Leeds, MD sent at 02/27/2019 11:22 AM EDT ----- No AF noted. If patient willing, can implant LINQ to monitor for AF.

## 2019-03-12 ENCOUNTER — Ambulatory Visit: Payer: Self-pay | Admitting: Cardiology

## 2019-04-25 ENCOUNTER — Telehealth: Payer: Self-pay

## 2019-04-25 ENCOUNTER — Encounter: Payer: Self-pay | Admitting: Adult Health

## 2019-04-25 ENCOUNTER — Ambulatory Visit (INDEPENDENT_AMBULATORY_CARE_PROVIDER_SITE_OTHER): Payer: MEDICAID | Admitting: Adult Health

## 2019-04-25 DIAGNOSIS — Z5329 Procedure and treatment not carried out because of patient's decision for other reasons: Secondary | ICD-10-CM

## 2019-04-25 NOTE — Telephone Encounter (Signed)
Patient was a no call/no show for their appointment today.   

## 2019-04-25 NOTE — Progress Notes (Deleted)
[facility] [address] [place]. [place] [address] [phone]       OFFICE FOLLOW UP NOTE  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 History obtained from patient and chart review. Reviewed all radiology images and labs personally.   Stroke admission 12/26/2018: [name] is a 58 y.o. male with history of HTN who presented to [facility] 4/22 w/ peripheral vision loss since wakingThurs 4/16 along w/ severe HA. As OP, found to have R ICA 90% stenosis and started on abx for sinus infection. CT at [facility] showed subacute R PCA infarct. Transferred to [facility] for neuro and vascular consult.  CTA head and neck showed nondominant L VA ends in PICA, right ICA 60 to 70% stenosis, left ICA less than 50% stenosis both with soft plaques, bilateral siphon arthrosclerosis and left ICA distal tandem stenosis.  MRI showed right PCA infarct.  2D echo unremarkable.  TCD Doppler with bubbles negative for PFO and lower extremity venous Dopplers negative for DVT.  Recommended loop recorder to rule out A. fib but patient but patient requested 30-day cardiac event monitor and referred to cardiology outpatient.  LDL 91 and A1c 5.4.  HTN stable.  Hyper coag work-up slightly elevated homocysteine and ESR/CRP.  Referred to outpatient vascular surgery for cerebrovascular atherosclerosis.  Recommended DAPT for 3 weeks then Plavix alone along with atorvastatin 40 mg daily.  Current tobacco use with smoking  cessation counseling provided.  Residual deficits of left lower quadrantanopia and recommended evaluation by ophthalmology to clear for driving.  7/48/2707 virtual visit: He has been doing well since hospital discharge with residual deficits of blurred vision L>R in his periphery and denies improvement.  He does endorse slight improvement with decreased lights and worsening with bright lights.  He does have visit scheduled with ophthalmology this week.  Recommended no driving until ophthalmology follow-up but did admit to occasional driving with only short distances.  He received his 30-day cardiac event monitor on Friday in the mail but as he was out of town this weekend, he plans on starting it this evening when he returns home.  Completed 3 weeks DAPT and continues on Plavix alone without side effects of bleeding or bruising.  Continues on atorvastatin without side effects myalgias.  Blood pressure stable.  Continues tobacco use but has been decreasing and plans on completely quitting.  No further concerns at this time.  Denies new or worsening stroke/TIA symptoms.  04/25/2019 update: [name] is a 58 year old male who is being seen today for stroke follow-up.  Residual deficits of blurred vision ***.  30-day cardiac event monitor completed which was negative for atrial fibrillation.  Discussion with cardiology regarding ILR implant and elected to hold off at that time as he did not have insurance.  He was advised to call cardiology office when and if he decides to proceed with implant.  Continues on clopidogrel and atorvastatin for secondary stroke prevention without myalgias.  Blood pressure ***.  Denies new or worsening stroke/TIA symptoms.   ROS:  14 system review of systems performed and negative with exception of blurred vision  PMH:  Past Medical History:  Diagnosis Date   Hypertension     PSH:  Past Surgical History:  Procedure Laterality Date   CHOLECYSTECTOMY      Social  History:  Social History   Socioeconomic History   Marital status: Married    Spouse name: Not on file   Number of children: Not on file   Years of education: Not on file   Highest education level: Not on file  Occupational History   Not on file  Social Needs   Financial resource strain: Not on file   Food insecurity    Worry: Not on file    Inability: Not on file   Transportation needs    Medical: Not on file    Non-medical: Not on file  Tobacco Use   Smoking status: Current Every Day Smoker    Packs/day: 2.00    Years: 20.00    Pack years: 40.00    Types: Cigarettes  Substance and Sexual Activity   Alcohol use: Never    Frequency: Never   Drug use: Never   Sexual activity: Not on file  Lifestyle   Physical activity    Days per week: Not on file    Minutes per session: Not on file   Stress: Not on file  Relationships   Social connections    Talks on phone: Not on file    Gets together: Not on file    Attends religious service: Not on file    Active member of club or organization: Not on file    Attends meetings of clubs or organizations: Not on file    Relationship status: Not on file   Intimate partner violence    Fear of current or ex partner: Not on file    Emotionally abused: Not on file    Physically abused: Not on file    Forced sexual activity: Not on file  Other Topics Concern   Not on file  Social History Narrative   Not on file    Family History:  Family History  Problem Relation Age of Onset   Diabetes Mother    Hypertension Father     Medications:   Current Outpatient Medications on File Prior to Visit  Medication Sig Dispense Refill   amoxicillin (AMOXIL) 875 MG tablet Take 875 mg by mouth 2 (two) times a day. For 14 days     atorvastatin (LIPITOR) 40 MG tablet Take 1 tablet (40 mg total) by mouth daily at 6 PM. 30 tablet 1   clopidogrel (PLAVIX) 75 MG tablet Take 1 tablet (75 mg total) by mouth daily. 30 tablet 2     ferrous sulfate 325 (65 FE) MG tablet Take 1 tablet (325 mg total) by mouth daily with breakfast. 30 tablet 3   loratadine (CLARITIN) 10 MG tablet Take 10 mg by mouth daily as needed for allergies or rhinitis.      traMADol (ULTRAM) 50 MG tablet Take 50 mg by mouth every 6 (six) hours as needed for pain.     valsartan-hydrochlorothiazide (DIOVAN-HCT) 160-12.5 MG tablet Take 1 tablet by mouth daily.     No current facility-administered medications on file prior to visit.     Allergies:  No Known Allergies   Physical Exam  There were no vitals filed for this visit. There is no height or weight on file to calculate BMI.   General: well developed,  well nourished, seated, in no evident distress Head: head normocephalic and atraumatic.   Neck: supple with no carotid or supraclavicular bruits Cardiovascular: regular rate and rhythm, no murmurs Musculoskeletal: no deformity Skin:  no rash/petichiae Vascular:  Normal pulses all extremities   Neurologic Exam Mental Status: Awake and fully alert. Oriented to place and time. Recent and remote memory intact. Attention span, concentration and fund of knowledge appropriate. Mood and affect appropriate.  Cranial Nerves: Fundoscopic exam reveals sharp disc margins. Pupils equal, briskly reactive to light. Extraocular movements full without nystagmus. Visual fields full to confrontation. Hearing intact. Facial sensation intact. Face, tongue, palate moves normally and symmetrically.  Motor: Normal bulk and tone. Normal strength in all tested extremity muscles. Sensory.: intact to touch , pinprick , position and vibratory sensation.  Coordination: Rapid alternating movements normal in all extremities. Finger-to-nose and heel-to-shin performed accurately bilaterally. Gait and Station: Arises from chair without difficulty. Stance is normal. Gait demonstrates normal stride length and balance Reflexes: 1+ and symmetric. Toes downgoing.     NIHSS   0 Modified Rankin  2    Diagnostic Data (Labs, Imaging, Testing)  CT ANGIO HEAD W OR WO CONTRAST CT ANGIO NECK W OR WO CONTRAST 12/26/2018 IMPRESSION: Bland RIGHT PCA territory infarct without significant disease of the dominant RIGHT vertebral, basilar, or RIGHT PCA. No evidence of fetal PCA anatomy. Extracranial atheromatous change at the carotid bifurcations, not clearly flow reducing. 60-70% stenosis on the RIGHT, less than 50% stenosis on the LEFT. Non dominant LEFT vertebral terminates in PICA. Muscular collaterals reconstitute the LEFT vertebral in the neck.  MR BRAN WO CONTRAST 12/26/2018 IMPRESSION: Intermediate sized right PCA territory acute infarct. No hemorrhage or mass effect.  ECHOCARDIOGRAM 12/27/2018 IMPRESSIONS  1. The left ventricle has normal systolic function with an ejection fraction of 60-65%. The cavity size was normal. There is severely increased left ventricular wall thickness. Left ventricular diastolic parameters were normal.  2. The right ventricle has normal systolic function. The cavity was normal. There is no increase in right ventricular wall thickness.  3. Negative bubble study for right to left shunting.  4. Mild thickening of the mitral valve leaflet. Mild calcification of the mitral valve leaflet.  5. The aortic valve is tricuspid. Mild thickening of the aortic valve. Mild calcification of the aortic valve.  6. Mild pulmonic stenosis.    ASSESSMENT: Marc Terry is a 58 y.o. year old male here with left PCA infarct embolic pattern on 05/07/4096 secondary to unknown source likely cardioembolic versus large vessel arthrosclerosis. Vascular risk factors include HTN, HLD, cerebrovascular arthrosclerosis and tobacco use.  Residual deficits of blurred peripheral vision    PLAN:  1. Left PCA infarct: Continue clopidogrel 75 mg daily  and atorvastatin for secondary stroke prevention. Maintain strict control of hypertension with blood pressure  goal below 130/90, diabetes with hemoglobin A1c goal below 6.5% and cholesterol with LDL cholesterol (bad cholesterol) goal below 70 mg/dL.  I also advised the patient to eat a healthy diet with plenty of whole grains, cereals, fruits and vegetables, exercise regularly with at least 30 minutes of continuous activity daily and maintain ideal body weight.  We will start 30-day cardiac event monitor today to assess for atrial fibrillation. 2. HTN: Advised to continue current treatment regimen.  Advised to continue to monitor at home along with continued follow-up with PCP for management 3. HLD: Advised to continue current treatment regimen along with continued follow-up with PCP for future prescribing and monitoring of lipid panel  4. Cerebrovascular arthrosclerosis: Completed DAPT and continues on Plavix and atorvastatin.  Recommended outpatient vascular surgery follow-up with Dr. Maylon Peppers. Early -appointment needs to be scheduled 5. Blurred vision: Residual deficit of PCA infarct.  Follow-up with ophthalmology as scheduled.  Advised no driving until released by ophthalmology 6. Tobacco use: Smoking cessation counseling and education provided    Follow up in 3 months or call earlier if needed   Greater than 50% of time during this 45 minute non-face-to-face visit was spent on counseling, explanation of diagnosis of left PCA infarct, reviewing risk factor management of HTN, tobacco use, cerebrovascular atherosclerosis and HLD, planning of further management along with potential future management, and discussion with patient and family answering all questions.    Venancio Poisson, AGNP-BC  [facility] [address] [place], [place] [address]  Phone [phone] Fax [phone] Note: This document was prepared with digital dictation and possible smart phrase technology. Any transcriptional errors that result from this process are unintentional.

## 2019-04-29 NOTE — Progress Notes (Signed)
No show for scheduled appointment 

## 2019-07-08 DIAGNOSIS — C155 Malignant neoplasm of lower third of esophagus: Secondary | ICD-10-CM

## 2019-07-24 DIAGNOSIS — C155 Malignant neoplasm of lower third of esophagus: Secondary | ICD-10-CM

## 2019-08-12 DIAGNOSIS — C155 Malignant neoplasm of lower third of esophagus: Secondary | ICD-10-CM

## 2019-09-10 DIAGNOSIS — C155 Malignant neoplasm of lower third of esophagus: Secondary | ICD-10-CM

## 2019-09-24 DIAGNOSIS — D509 Iron deficiency anemia, unspecified: Secondary | ICD-10-CM

## 2019-09-24 DIAGNOSIS — C155 Malignant neoplasm of lower third of esophagus: Secondary | ICD-10-CM

## 2019-10-08 DIAGNOSIS — C155 Malignant neoplasm of lower third of esophagus: Secondary | ICD-10-CM

## 2019-10-22 DIAGNOSIS — C155 Malignant neoplasm of lower third of esophagus: Secondary | ICD-10-CM

## 2019-11-05 DIAGNOSIS — C155 Malignant neoplasm of lower third of esophagus: Secondary | ICD-10-CM | POA: Diagnosis not present

## 2019-11-05 DIAGNOSIS — D509 Iron deficiency anemia, unspecified: Secondary | ICD-10-CM

## 2019-11-26 DIAGNOSIS — C155 Malignant neoplasm of lower third of esophagus: Secondary | ICD-10-CM

## 2020-01-07 DIAGNOSIS — C155 Malignant neoplasm of lower third of esophagus: Secondary | ICD-10-CM | POA: Diagnosis not present

## 2020-01-07 DIAGNOSIS — D509 Iron deficiency anemia, unspecified: Secondary | ICD-10-CM | POA: Diagnosis not present

## 2020-01-28 DIAGNOSIS — D509 Iron deficiency anemia, unspecified: Secondary | ICD-10-CM

## 2020-01-28 DIAGNOSIS — C155 Malignant neoplasm of lower third of esophagus: Secondary | ICD-10-CM

## 2020-02-18 DIAGNOSIS — C155 Malignant neoplasm of lower third of esophagus: Secondary | ICD-10-CM

## 2020-02-18 DIAGNOSIS — D509 Iron deficiency anemia, unspecified: Secondary | ICD-10-CM | POA: Diagnosis not present

## 2020-03-10 DIAGNOSIS — C155 Malignant neoplasm of lower third of esophagus: Secondary | ICD-10-CM | POA: Diagnosis not present

## 2020-03-31 DIAGNOSIS — C155 Malignant neoplasm of lower third of esophagus: Secondary | ICD-10-CM

## 2020-03-31 DIAGNOSIS — D509 Iron deficiency anemia, unspecified: Secondary | ICD-10-CM

## 2020-04-21 DIAGNOSIS — C155 Malignant neoplasm of lower third of esophagus: Secondary | ICD-10-CM

## 2020-05-13 DIAGNOSIS — D509 Iron deficiency anemia, unspecified: Secondary | ICD-10-CM | POA: Diagnosis not present

## 2020-05-13 DIAGNOSIS — C155 Malignant neoplasm of lower third of esophagus: Secondary | ICD-10-CM

## 2020-06-14 ENCOUNTER — Encounter: Payer: Self-pay | Admitting: Pharmacist

## 2020-06-14 DIAGNOSIS — C155 Malignant neoplasm of lower third of esophagus: Secondary | ICD-10-CM

## 2020-06-22 ENCOUNTER — Other Ambulatory Visit: Payer: Self-pay | Admitting: Hematology and Oncology

## 2020-06-22 DIAGNOSIS — C155 Malignant neoplasm of lower third of esophagus: Secondary | ICD-10-CM

## 2020-06-22 LAB — HEPATIC FUNCTION PANEL
ALT: 15 (ref 10–40)
AST: 26 (ref 14–40)
Alkaline Phosphatase: 179 — AB (ref 25–125)
Bilirubin, Total: 0.4

## 2020-06-22 LAB — COMPREHENSIVE METABOLIC PANEL
Albumin: 3.8 (ref 3.5–5.0)
Calcium: 9.2 (ref 8.7–10.7)

## 2020-06-22 LAB — CBC AND DIFFERENTIAL
HCT: 44 (ref 41–53)
Hemoglobin: 14.7 (ref 13.5–17.5)
Neutrophils Absolute: 4620
Platelets: 285 (ref 150–399)
WBC: 6.6

## 2020-06-22 LAB — BASIC METABOLIC PANEL
BUN: 16 (ref 4–21)
CO2: 29 — AB (ref 13–22)
Chloride: 105 (ref 99–108)
Creatinine: 0.8 (ref 0.6–1.3)
Glucose: 125
Potassium: 3.7 (ref 3.4–5.3)
Sodium: 140 (ref 137–147)

## 2020-06-22 LAB — CBC: RBC: 4.57 (ref 3.87–5.11)

## 2020-06-23 ENCOUNTER — Other Ambulatory Visit: Payer: Self-pay

## 2020-06-23 ENCOUNTER — Telehealth: Payer: Self-pay | Admitting: Oncology

## 2020-06-23 ENCOUNTER — Inpatient Hospital Stay: Payer: Medicaid Other | Attending: Oncology

## 2020-06-23 ENCOUNTER — Other Ambulatory Visit: Payer: Self-pay | Admitting: Hematology and Oncology

## 2020-06-23 VITALS — BP 223/114 | HR 90 | Temp 98.8°F | Resp 18 | Ht 65.0 in | Wt 154.0 lb

## 2020-06-23 DIAGNOSIS — C155 Malignant neoplasm of lower third of esophagus: Secondary | ICD-10-CM

## 2020-06-23 DIAGNOSIS — Z5111 Encounter for antineoplastic chemotherapy: Secondary | ICD-10-CM | POA: Diagnosis not present

## 2020-06-23 DIAGNOSIS — C778 Secondary and unspecified malignant neoplasm of lymph nodes of multiple regions: Secondary | ICD-10-CM | POA: Insufficient documentation

## 2020-06-23 DIAGNOSIS — I10 Essential (primary) hypertension: Secondary | ICD-10-CM

## 2020-06-23 DIAGNOSIS — C160 Malignant neoplasm of cardia: Secondary | ICD-10-CM | POA: Insufficient documentation

## 2020-06-23 MED ORDER — SODIUM CHLORIDE 0.9 % IV SOLN
2400.0000 mg/m2 | INTRAVENOUS | Status: DC
  Administered 2020-06-23: 4300 mg via INTRAVENOUS
  Filled 2020-06-23: qty 86

## 2020-06-23 MED ORDER — FLUOROURACIL CHEMO INJECTION 2.5 GM/50ML
400.0000 mg/m2 | Freq: Once | INTRAVENOUS | Status: AC
  Administered 2020-06-23: 700 mg via INTRAVENOUS
  Filled 2020-06-23: qty 14

## 2020-06-23 MED ORDER — DEXTROSE 5 % IV SOLN
Freq: Once | INTRAVENOUS | Status: AC
  Filled 2020-06-23: qty 250

## 2020-06-23 MED ORDER — SODIUM CHLORIDE 0.9 % IV SOLN
10.0000 mg | Freq: Once | INTRAVENOUS | Status: AC
  Administered 2020-06-23: 10 mg via INTRAVENOUS
  Filled 2020-06-23: qty 10

## 2020-06-23 MED ORDER — CLONIDINE HCL 0.1 MG PO TABS
ORAL_TABLET | ORAL | Status: AC
  Filled 2020-06-23: qty 1

## 2020-06-23 MED ORDER — CLONIDINE HCL 0.1 MG PO TABS
0.1000 mg | ORAL_TABLET | Freq: Once | ORAL | Status: AC
  Administered 2020-06-23: 0.1 mg via ORAL

## 2020-06-23 MED ORDER — ONDANSETRON HCL 4 MG/2ML IJ SOLN
INTRAMUSCULAR | Status: AC
  Filled 2020-06-23: qty 4

## 2020-06-23 MED ORDER — SODIUM CHLORIDE 0.9 % IV SOLN
400.0000 mg/m2 | Freq: Once | INTRAVENOUS | Status: AC
  Administered 2020-06-23: 720 mg via INTRAVENOUS
  Filled 2020-06-23: qty 36

## 2020-06-23 MED ORDER — ONDANSETRON HCL 4 MG/2ML IJ SOLN
8.0000 mg | Freq: Once | INTRAMUSCULAR | Status: AC
  Administered 2020-06-23: 8 mg via INTRAVENOUS

## 2020-06-23 NOTE — Telephone Encounter (Signed)
Scheduled appts per treatment plan, and converted pending appts from [facility]. Gave pt a print out of appt calendar.

## 2020-06-23 NOTE — Patient Instructions (Signed)
Leucovorin injection What is this medicine? LEUCOVORIN (loo koe VOR in) is used to prevent or treat the harmful effects of some medicines. This medicine is used to treat anemia caused by a low amount of folic acid in the body. It is also used with 5-fluorouracil (5-FU) to treat colon cancer. This medicine may be used for other purposes; ask your health care provider or pharmacist if you have questions. What should I tell my health care provider before I take this medicine? They need to know if you have any of these conditions:  anemia from low levels of vitamin B-12 in the blood  an unusual or allergic reaction to leucovorin, folic acid, other medicines, foods, dyes, or preservatives  pregnant or trying to get pregnant  breast-feeding How should I use this medicine? This medicine is for injection into a muscle or into a vein. It is given by a health care professional in a hospital or clinic setting. Talk to your pediatrician regarding the use of this medicine in children. Special care may be needed. Overdosage: If you think you have taken too much of this medicine contact a poison control center or emergency room at once. NOTE: This medicine is only for you. Do not share this medicine with others. What if I miss a dose? This does not apply. What may interact with this medicine?  capecitabine  fluorouracil  phenobarbital  phenytoin  primidone  trimethoprim-sulfamethoxazole This list may not describe all possible interactions. Give your health care provider a list of all the medicines, herbs, non-prescription drugs, or dietary supplements you use. Also tell them if you smoke, drink alcohol, or use illegal drugs. Some items may interact with your medicine. What should I watch for while using this medicine? Your condition will be monitored carefully while you are receiving this medicine. This medicine may increase the side effects of 5-fluorouracil, 5-FU. Tell your doctor or health  care professional if you have diarrhea or mouth sores that do not get better or that get worse. What side effects may I notice from receiving this medicine? Side effects that you should report to your doctor or health care professional as soon as possible:  allergic reactions like skin rash, itching or hives, swelling of the face, lips, or tongue  breathing problems  fever, infection  mouth sores  unusual bleeding or bruising  unusually weak or tired Side effects that usually do not require medical attention (report to your doctor or health care professional if they continue or are bothersome):  constipation or diarrhea  loss of appetite  nausea, vomiting This list may not describe all possible side effects. Call your doctor for medical advice about side effects. You may report side effects to FDA at 1-800-FDA-1088. Where should I keep my medicine? This drug is given in a hospital or clinic and will not be stored at home. NOTE: This sheet is a summary. It may not cover all possible information. If you have questions about this medicine, talk to your doctor, pharmacist, or health care provider.  2020 Elsevier/Gold Standard (2008-02-26 16:50:29) Fluorouracil, 5-FU injection What is this medicine? FLUOROURACIL, 5-FU (flure oh YOOR a sil) is a chemotherapy drug. It slows the growth of cancer cells. This medicine is used to treat many types of cancer like breast cancer, colon or rectal cancer, pancreatic cancer, and stomach cancer. This medicine may be used for other purposes; ask your health care provider or pharmacist if you have questions. COMMON BRAND NAME(S): Adrucil What should I tell my  health care provider before I take this medicine? They need to know if you have any of these conditions:  blood disorders  dihydropyrimidine dehydrogenase (DPD) deficiency  infection (especially a virus infection such as chickenpox, cold sores, or herpes)  kidney disease  liver  disease  malnourished, poor nutrition  recent or ongoing radiation therapy  an unusual or allergic reaction to fluorouracil, other chemotherapy, other medicines, foods, dyes, or preservatives  pregnant or trying to get pregnant  breast-feeding How should I use this medicine? This drug is given as an infusion or injection into a vein. It is administered in a hospital or clinic by a specially trained health care professional. Talk to your pediatrician regarding the use of this medicine in children. Special care may be needed. Overdosage: If you think you have taken too much of this medicine contact a poison control center or emergency room at once. NOTE: This medicine is only for you. Do not share this medicine with others. What if I miss a dose? It is important not to miss your dose. Call your doctor or health care professional if you are unable to keep an appointment. What may interact with this medicine?  allopurinol  cimetidine  dapsone  digoxin  hydroxyurea  leucovorin  levamisole  medicines for seizures like ethotoin, fosphenytoin, phenytoin  medicines to increase blood counts like filgrastim, pegfilgrastim, sargramostim  medicines that treat or prevent blood clots like warfarin, enoxaparin, and dalteparin  methotrexate  metronidazole  pyrimethamine  some other chemotherapy drugs like busulfan, cisplatin, estramustine, vinblastine  trimethoprim  trimetrexate  vaccines Talk to your doctor or health care professional before taking any of these medicines:  acetaminophen  aspirin  ibuprofen  ketoprofen  naproxen This list may not describe all possible interactions. Give your health care provider a list of all the medicines, herbs, non-prescription drugs, or dietary supplements you use. Also tell them if you smoke, drink alcohol, or use illegal drugs. Some items may interact with your medicine. What should I watch for while using this medicine? Visit  your doctor for checks on your progress. This drug may make you feel generally unwell. This is not uncommon, as chemotherapy can affect healthy cells as well as cancer cells. Report any side effects. Continue your course of treatment even though you feel ill unless your doctor tells you to stop. In some cases, you may be given additional medicines to help with side effects. Follow all directions for their use. Call your doctor or health care professional for advice if you get a fever, chills or sore throat, or other symptoms of a cold or flu. Do not treat yourself. This drug decreases your body's ability to fight infections. Try to avoid being around people who are sick. This medicine may increase your risk to bruise or bleed. Call your doctor or health care professional if you notice any unusual bleeding. Be careful brushing and flossing your teeth or using a toothpick because you may get an infection or bleed more easily. If you have any dental work done, tell your dentist you are receiving this medicine. Avoid taking products that contain aspirin, acetaminophen, ibuprofen, naproxen, or ketoprofen unless instructed by your doctor. These medicines may hide a fever. Do not become pregnant while taking this medicine. Women should inform their doctor if they wish to become pregnant or think they might be pregnant. There is a potential for serious side effects to an unborn child. Talk to your health care professional or pharmacist for more information. Do  not breast-feed an infant while taking this medicine. Men should inform their doctor if they wish to father a child. This medicine may lower sperm counts. Do not treat diarrhea with over the counter products. Contact your doctor if you have diarrhea that lasts more than 2 days or if it is severe and watery. This medicine can make you more sensitive to the sun. Keep out of the sun. If you cannot avoid being in the sun, wear protective clothing and use  sunscreen. Do not use sun lamps or tanning beds/booths. What side effects may I notice from receiving this medicine? Side effects that you should report to your doctor or health care professional as soon as possible:  allergic reactions like skin rash, itching or hives, swelling of the face, lips, or tongue  low blood counts - this medicine may decrease the number of white blood cells, red blood cells and platelets. You may be at increased risk for infections and bleeding.  signs of infection - fever or chills, cough, sore throat, pain or difficulty passing urine  signs of decreased platelets or bleeding - bruising, pinpoint red spots on the skin, black, tarry stools, blood in the urine  signs of decreased red blood cells - unusually weak or tired, fainting spells, lightheadedness  breathing problems  changes in vision  chest pain  mouth sores  nausea and vomiting  pain, swelling, redness at site where injected  pain, tingling, numbness in the hands or feet  redness, swelling, or sores on hands or feet  stomach pain  unusual bleeding Side effects that usually do not require medical attention (report to your doctor or health care professional if they continue or are bothersome):  changes in finger or toe nails  diarrhea  dry or itchy skin  hair loss  headache  loss of appetite  sensitivity of eyes to the light  stomach upset  unusually teary eyes This list may not describe all possible side effects. Call your doctor for medical advice about side effects. You may report side effects to FDA at 1-800-FDA-1088. Where should I keep my medicine? This drug is given in a hospital or clinic and will not be stored at home. NOTE: This sheet is a summary. It may not cover all possible information. If you have questions about this medicine, talk to your doctor, pharmacist, or health care provider.  2020 Elsevier/Gold Standard (2007-12-26 13:53:16)

## 2020-06-23 NOTE — Progress Notes (Signed)
PT STABLE AT TIME OF DISCHARGE 

## 2020-06-25 ENCOUNTER — Other Ambulatory Visit: Payer: Self-pay

## 2020-06-25 ENCOUNTER — Inpatient Hospital Stay: Payer: Medicaid Other

## 2020-07-07 NOTE — Progress Notes (Signed)
PT STABLE AT TIME OF DISCHARGE 

## 2020-07-12 ENCOUNTER — Other Ambulatory Visit: Payer: Self-pay | Admitting: Oncology

## 2020-07-12 DIAGNOSIS — C155 Malignant neoplasm of lower third of esophagus: Secondary | ICD-10-CM

## 2020-07-12 NOTE — Progress Notes (Signed)
[facility]  [address] [place],  [place]  [address] [phone]  Clinic Day:  07/13/2020  Referring physician: Bonnita Nasuti, MD   HISTORY OF PRESENT ILLNESS:  The patient is a 59 y.o. 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  He comes in today to be evaluated before he heads into his 19th cycle of infusional 5-fluorouracil/leucovorin.  Since his last visit, the patient has been doing okay.  Over the past 24 hours, he has had increased pain around his PEG tube. He has seen more drainage coming from it, but claims to keep the area clean.  He still has nondescript right upper quadrant abdominal pain.  A recent CT scan did not show any findings to explain the pain in this area.  Furthermore, his primary care office ordered a RUQ ultrasound, which also apparently came back negative for pathology in this area.  His wife is more concerned as he has been more emotionally distraught and disconnected.  Of note, he approaches 1 year into his treatments for his metastatic disease.    PHYSICAL EXAM:  Blood pressure (!) 158/74, pulse 76, temperature 98.7 F (37.1 C), temperature source Oral, resp. rate 16, height 5\' 5"  (1.651 m), weight 152 lb 11.2 oz (69.3 kg), SpO2 94 %. Wt Readings from Last 3 Encounters:  07/13/20 152 lb 11.2 oz (69.3 kg)  07/07/20 153 lb 3 oz (69.5 kg)  06/29/20 153 lb 3 oz (69.5 kg)   Body mass index is 25.41 kg/m. Performance status (ECOG): 1 Physical Exam Constitutional:      Appearance: Normal appearance. He is not ill-appearing.  HENT:     Mouth/Throat:     Mouth: Mucous membranes are moist.     Pharynx: Oropharynx is clear. No oropharyngeal exudate or posterior oropharyngeal erythema.  Cardiovascular:     Rate and Rhythm: Normal rate and regular rhythm.     Heart sounds: No murmur heard.  No friction rub. No gallop.     Pulmonary:     Effort: Pulmonary effort is normal. No respiratory distress.     Breath sounds: Normal breath sounds. No wheezing, rhonchi or rales.  Abdominal:     General: Bowel sounds are normal. There is distension (mild distension; PEG tube area is not infected).     Palpations: Abdomen is soft. There is no mass.     Tenderness: There is no abdominal tenderness.  Musculoskeletal:        General: No swelling.     Right lower leg: No edema.     Left lower leg: No edema.  Lymphadenopathy:     Cervical: No cervical adenopathy.     Upper Body:     Right upper body: No supraclavicular or axillary adenopathy.     Left upper body: No supraclavicular or axillary adenopathy.     Lower Body: No right inguinal adenopathy. No left inguinal adenopathy.  Skin:    General: Skin is warm.     Coloration: Skin is not jaundiced.     Findings: No lesion or rash.  Neurological:     General: No focal deficit present.     Mental Status: He is alert and oriented to person, place, and time. Mental status is at baseline.     Cranial Nerves: Cranial nerves are intact.  Psychiatric:        Mood and Affect: Mood normal.  Behavior: Behavior normal.        Thought Content: Thought content normal.     LABS:      ASSESSMENT & PLAN:   Assessment/Plan:  A 59 y.o. 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  He will proceed with his 19th cycle of palliative chemotherapy this week, which consists of infusional 5-fluorouracil/leucovorin.  Physically, the patient looks great.  I emphasized to him the importance of emotionally and mentally being in a good place as well, which will be very important as he continue to fight his disease.  I believe some of his distension/discomfort may be due to the fact that he has not had a quality bowel movement in 3 days.  I encouraged him to use whatever necessary to accomplish this.  There remains nothing per his exam that has me worried about overt  disease progression.  I will see him back in 3 weeks before he heads into his 20th cycle of palliative chemotherapy.  The patient understands all the plans discussed today and is in agreement with them.      Marc Menden Macarthur Critchley, MD

## 2020-07-13 ENCOUNTER — Other Ambulatory Visit: Payer: Self-pay | Admitting: Oncology

## 2020-07-13 ENCOUNTER — Inpatient Hospital Stay: Payer: Medicaid Other | Admitting: Hematology and Oncology

## 2020-07-13 ENCOUNTER — Inpatient Hospital Stay: Payer: Medicaid Other | Attending: Oncology | Admitting: Oncology

## 2020-07-13 ENCOUNTER — Other Ambulatory Visit: Payer: Self-pay

## 2020-07-13 ENCOUNTER — Encounter: Payer: Self-pay | Admitting: Oncology

## 2020-07-13 VITALS — BP 158/74 | HR 76 | Temp 98.7°F | Resp 16 | Ht 65.0 in | Wt 152.7 lb

## 2020-07-13 DIAGNOSIS — C155 Malignant neoplasm of lower third of esophagus: Secondary | ICD-10-CM

## 2020-07-13 DIAGNOSIS — Z5111 Encounter for antineoplastic chemotherapy: Secondary | ICD-10-CM | POA: Insufficient documentation

## 2020-07-13 DIAGNOSIS — C778 Secondary and unspecified malignant neoplasm of lymph nodes of multiple regions: Secondary | ICD-10-CM | POA: Insufficient documentation

## 2020-07-13 DIAGNOSIS — C160 Malignant neoplasm of cardia: Secondary | ICD-10-CM | POA: Insufficient documentation

## 2020-07-13 LAB — BASIC METABOLIC PANEL
BUN: 26 — AB (ref 4–21)
CO2: 25 — AB (ref 13–22)
Chloride: 108 (ref 99–108)
Creatinine: 0.8 (ref 0.6–1.3)
Glucose: 117
Potassium: 4.1 (ref 3.4–5.3)
Sodium: 140 (ref 137–147)

## 2020-07-13 LAB — CBC AND DIFFERENTIAL
HCT: 44 (ref 41–53)
Hemoglobin: 14.4 (ref 13.5–17.5)
Neutrophils Absolute: 5.63
Platelets: 311 (ref 150–399)
WBC: 7.5

## 2020-07-13 LAB — COMPREHENSIVE METABOLIC PANEL
Albumin: 3.8 (ref 3.5–5.0)
Calcium: 9.2 (ref 8.7–10.7)

## 2020-07-13 LAB — CBC: RBC: 4.46 (ref 3.87–5.11)

## 2020-07-13 LAB — HEPATIC FUNCTION PANEL
ALT: 14 (ref 10–40)
AST: 28 (ref 14–40)
Alkaline Phosphatase: 179 — AB (ref 25–125)
Bilirubin, Total: 0.6

## 2020-07-13 NOTE — Progress Notes (Signed)
Pt spouse states that for the last week he sleeps most of the day.  Very distanced from family.  Goes into another room alone in the house and sits or sleeps.  Very hateful.

## 2020-07-14 ENCOUNTER — Inpatient Hospital Stay: Payer: Medicaid Other

## 2020-07-14 VITALS — BP 140/66 | HR 82 | Temp 97.8°F | Resp 18 | Ht 65.0 in | Wt 152.5 lb

## 2020-07-14 DIAGNOSIS — C778 Secondary and unspecified malignant neoplasm of lymph nodes of multiple regions: Secondary | ICD-10-CM | POA: Diagnosis present

## 2020-07-14 DIAGNOSIS — C160 Malignant neoplasm of cardia: Secondary | ICD-10-CM | POA: Diagnosis present

## 2020-07-14 DIAGNOSIS — Z5111 Encounter for antineoplastic chemotherapy: Secondary | ICD-10-CM | POA: Diagnosis not present

## 2020-07-14 DIAGNOSIS — C155 Malignant neoplasm of lower third of esophagus: Secondary | ICD-10-CM

## 2020-07-14 MED ORDER — LEUCOVORIN CALCIUM INJECTION 350 MG
400.0000 mg/m2 | Freq: Once | INTRAVENOUS | Status: AC
  Administered 2020-07-14: 720 mg via INTRAVENOUS
  Filled 2020-07-14: qty 36

## 2020-07-14 MED ORDER — ONDANSETRON HCL 4 MG/2ML IJ SOLN
8.0000 mg | Freq: Once | INTRAMUSCULAR | Status: AC
  Administered 2020-07-14: 8 mg via INTRAVENOUS

## 2020-07-14 MED ORDER — FLUOROURACIL CHEMO INJECTION 2.5 GM/50ML
400.0000 mg/m2 | Freq: Once | INTRAVENOUS | Status: AC
  Administered 2020-07-14: 700 mg via INTRAVENOUS
  Filled 2020-07-14: qty 14

## 2020-07-14 MED ORDER — SODIUM CHLORIDE 0.9 % IV SOLN
10.0000 mg | Freq: Once | INTRAVENOUS | Status: AC
  Administered 2020-07-14: 10 mg via INTRAVENOUS
  Filled 2020-07-14: qty 10

## 2020-07-14 MED ORDER — SODIUM CHLORIDE 0.9 % IV SOLN
2400.0000 mg/m2 | INTRAVENOUS | Status: DC
  Administered 2020-07-14: 4300 mg via INTRAVENOUS
  Filled 2020-07-14: qty 86

## 2020-07-14 MED ORDER — SODIUM CHLORIDE 0.9% FLUSH
10.0000 mL | INTRAVENOUS | Status: DC | PRN
  Filled 2020-07-14: qty 10

## 2020-07-14 MED ORDER — SODIUM CHLORIDE 0.9 % IV SOLN
Freq: Once | INTRAVENOUS | Status: AC
  Filled 2020-07-14: qty 250

## 2020-07-14 MED ORDER — ONDANSETRON HCL 4 MG/2ML IJ SOLN
INTRAMUSCULAR | Status: AC
  Filled 2020-07-14: qty 4

## 2020-07-14 MED ORDER — HEPARIN SOD (PORK) LOCK FLUSH 100 UNIT/ML IV SOLN
500.0000 [IU] | Freq: Once | INTRAVENOUS | Status: DC | PRN
  Filled 2020-07-14: qty 5

## 2020-07-14 NOTE — Progress Notes (Signed)
PT STABLE AT TIME OF DISCHARGE 

## 2020-07-14 NOTE — Patient Instructions (Signed)
[facility] Discharge Instructions for Patients Receiving Chemotherapy  Today you received the following chemotherapy agents LEUCOVORIN, FLUOROURACIL,   To help prevent nausea and vomiting after your treatment, we encourage you to take your nausea medication.   If you develop nausea and vomiting that is not controlled by your nausea medication, call the clinic.   BELOW ARE SYMPTOMS THAT SHOULD BE REPORTED IMMEDIATELY:  *FEVER GREATER THAN 100.5 F  *CHILLS WITH OR WITHOUT FEVER  NAUSEA AND VOMITING THAT IS NOT CONTROLLED WITH YOUR NAUSEA MEDICATION  *UNUSUAL SHORTNESS OF BREATH  *UNUSUAL BRUISING OR BLEEDING  TENDERNESS IN MOUTH AND THROAT WITH OR WITHOUT PRESENCE OF ULCERS  *URINARY PROBLEMS  *BOWEL PROBLEMS  UNUSUAL RASH Items with * indicate a potential emergency and should be followed up as soon as possible.  Feel free to call the clinic should you have any questions or concerns at The clinic phone number is [phone].  Please show the [facility] at check-in to the Emergency Department and triage nurse.

## 2020-07-16 ENCOUNTER — Other Ambulatory Visit: Payer: Self-pay

## 2020-07-16 ENCOUNTER — Inpatient Hospital Stay: Payer: Medicaid Other

## 2020-07-16 VITALS — BP 149/76 | HR 91 | Temp 98.6°F | Resp 18 | Ht 65.0 in | Wt 155.0 lb

## 2020-07-16 DIAGNOSIS — Z5111 Encounter for antineoplastic chemotherapy: Secondary | ICD-10-CM | POA: Diagnosis not present

## 2020-07-16 DIAGNOSIS — C155 Malignant neoplasm of lower third of esophagus: Secondary | ICD-10-CM

## 2020-07-16 MED ORDER — SODIUM CHLORIDE 0.9% FLUSH
10.0000 mL | INTRAVENOUS | Status: DC | PRN
  Administered 2020-07-16: 10 mL
  Filled 2020-07-16: qty 10

## 2020-07-16 MED ORDER — HEPARIN SOD (PORK) LOCK FLUSH 100 UNIT/ML IV SOLN
500.0000 [IU] | Freq: Once | INTRAVENOUS | Status: AC | PRN
  Administered 2020-07-16: 500 [IU]
  Filled 2020-07-16: qty 5

## 2020-07-16 NOTE — Patient Instructions (Signed)
[facility] Discharge Instructions for Patients Receiving Chemotherapy  Today you received the following chemotherapy agents 5FU INFUSION PUMP D/C TODAY To help prevent nausea and vomiting after your treatment, we encourage you to take your nausea medication.   If you develop nausea and vomiting that is not controlled by your nausea medication, call the clinic.   BELOW ARE SYMPTOMS THAT SHOULD BE REPORTED IMMEDIATELY:  *FEVER GREATER THAN 100.5 F  *CHILLS WITH OR WITHOUT FEVER  NAUSEA AND VOMITING THAT IS NOT CONTROLLED WITH YOUR NAUSEA MEDICATION  *UNUSUAL SHORTNESS OF BREATH  *UNUSUAL BRUISING OR BLEEDING  TENDERNESS IN MOUTH AND THROAT WITH OR WITHOUT PRESENCE OF ULCERS  *URINARY PROBLEMS  *BOWEL PROBLEMS  UNUSUAL RASH Items with * indicate a potential emergency and should be followed up as soon as possible.  Feel free to call the clinic should you have any questions or concerns at The clinic phone number is [phone].  Please show the [facility] at check-in to the Emergency Department and triage nurse.

## 2020-07-16 NOTE — Progress Notes (Signed)
PT STABLE AT TIME OF DISCHARGE 

## 2020-07-28 ENCOUNTER — Other Ambulatory Visit: Payer: Self-pay | Admitting: Oncology

## 2020-07-28 DIAGNOSIS — C155 Malignant neoplasm of lower third of esophagus: Secondary | ICD-10-CM

## 2020-07-28 NOTE — Progress Notes (Signed)
[facility]  [address] [place],  [place]  [address] [phone]  Clinic Day:  07/29/2020  Referring physician: Bonnita Nasuti, MD   HISTORY OF PRESENT ILLNESS:  The patient is a 59 y.o. 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  He comes in today to be evaluated before he heads into his 20th cycle of infusional 5-fluorouracil/leucovorin.  Since his last visit, the patient has been doing okay.  Although he still has occasional right upper quadrant, it has been manageable.  He denies having any new symptoms or findings which concern him for disease progression.   PHYSICAL EXAM:  Blood pressure (!) 153/72, pulse 80, temperature 98.1 F (36.7 C), resp. rate 18, height 5\' 5"  (1.651 m), weight 151 lb 9.6 oz (68.8 kg), SpO2 97 %. Wt Readings from Last 3 Encounters:  07/29/20 151 lb 9.6 oz (68.8 kg)  07/16/20 155 lb 0.4 oz (70.3 kg)  07/14/20 152 lb 8 oz (69.2 kg)   Body mass index is 25.23 kg/m. Performance status (ECOG): 1 Physical Exam Constitutional:      Appearance: Normal appearance. He is not ill-appearing.  HENT:     Mouth/Throat:     Mouth: Mucous membranes are moist.     Pharynx: Oropharynx is clear. No oropharyngeal exudate or posterior oropharyngeal erythema.  Cardiovascular:     Rate and Rhythm: Normal rate and regular rhythm.     Heart sounds: No murmur heard.  No friction rub. No gallop.   Pulmonary:     Effort: Pulmonary effort is normal. No respiratory distress.     Breath sounds: Normal breath sounds. No wheezing, rhonchi or rales.  Abdominal:     General: Bowel sounds are normal. There is no distension (mild distension; PEG tube area is not infected).     Palpations: Abdomen is soft. There is no mass.     Tenderness: There is no abdominal tenderness.  Musculoskeletal:        General: No swelling.     Right lower  leg: No edema.     Left lower leg: No edema.  Lymphadenopathy:     Cervical: No cervical adenopathy.     Upper Body:     Right upper body: No supraclavicular or axillary adenopathy.     Left upper body: No supraclavicular or axillary adenopathy.     Lower Body: No right inguinal adenopathy. No left inguinal adenopathy.  Skin:    General: Skin is warm.     Coloration: Skin is not jaundiced.     Findings: No lesion or rash.  Neurological:     General: No focal deficit present.     Mental Status: He is alert and oriented to person, place, and time. Mental status is at baseline.     Cranial Nerves: Cranial nerves are intact.  Psychiatric:        Mood and Affect: Mood normal.        Behavior: Behavior normal.        Thought Content: Thought content normal.     LABS:    Ref. Range 07/29/2020 00:00  Sodium Latest Ref Range: 137 - 147  140  Potassium Latest Ref Range: 3.4 - 5.3  4.1  Chloride Latest Ref Range: 99 - 108  107  CO2 Latest Ref Range: 13 - 22  23 (A)  Glucose Unknown 128  BUN Latest Ref Range: 4 -  21  24 (A)  Creatinine Latest Ref Range: 0.6 - 1.3  0.8  Calcium Latest Ref Range: 8.7 - 10.7  9.0  Alkaline Phosphatase Latest Ref Range: 25 - 125  232 (A)  Albumin Latest Ref Range: 3.5 - 5.0  3.7  AST Latest Ref Range: 14 - 40  22  ALT Latest Ref Range: 10 - 40  46 (A)  Bilirubin, Total Unknown 0.5  WBC Unknown 9.2  RBC Latest Ref Range: 3.87 - 5.11  4.11  Hemoglobin Latest Ref Range: 13.5 - 17.5  13.6  HCT Latest Ref Range: 41 - 53  41  Platelets Latest Ref Range: 150 - 399  226    ASSESSMENT & PLAN:   Assessment/Plan:  A 59 y.o. 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  He will proceed with his 20th cycle of palliative chemotherapy next week, which consists of infusional 5-fluorouracil/leucovorin.  Physically, the patient looks great.  I will see him back 3 weeks later before he heads into his 21st cycle of palliative chemotherapy.  CT scans of his  chest/abdomen/pelvis will be done before his next visit to ascertain his new disease baseline after 20 cycles of treatment.  The patient understands all the plans discussed today and is in agreement with them.     Dwyne Hasegawa Macarthur Critchley, MD

## 2020-07-29 ENCOUNTER — Other Ambulatory Visit: Payer: Self-pay

## 2020-07-29 ENCOUNTER — Other Ambulatory Visit: Payer: Self-pay | Admitting: Oncology

## 2020-07-29 ENCOUNTER — Inpatient Hospital Stay (INDEPENDENT_AMBULATORY_CARE_PROVIDER_SITE_OTHER): Payer: Medicaid Other | Admitting: Oncology

## 2020-07-29 ENCOUNTER — Inpatient Hospital Stay: Payer: Medicaid Other | Admitting: Hematology and Oncology

## 2020-07-29 VITALS — BP 153/72 | HR 80 | Temp 98.1°F | Resp 18 | Ht 65.0 in | Wt 151.6 lb

## 2020-07-29 DIAGNOSIS — C155 Malignant neoplasm of lower third of esophagus: Secondary | ICD-10-CM

## 2020-07-29 LAB — BASIC METABOLIC PANEL
BUN: 24 — AB (ref 4–21)
CO2: 23 — AB (ref 13–22)
Chloride: 107 (ref 99–108)
Creatinine: 0.8 (ref 0.6–1.3)
Glucose: 128
Potassium: 4.1 (ref 3.4–5.3)
Sodium: 140 (ref 137–147)

## 2020-07-29 LAB — CBC AND DIFFERENTIAL
HCT: 41 (ref 41–53)
Hemoglobin: 13.6 (ref 13.5–17.5)
Neutrophils Absolute: 7.64
Platelets: 226 (ref 150–399)
WBC: 9.2

## 2020-07-29 LAB — HEPATIC FUNCTION PANEL
ALT: 46 — AB (ref 10–40)
AST: 22 (ref 14–40)
Alkaline Phosphatase: 232 — AB (ref 25–125)
Bilirubin, Total: 0.5

## 2020-07-29 LAB — COMPREHENSIVE METABOLIC PANEL
Albumin: 3.7 (ref 3.5–5.0)
Calcium: 9 (ref 8.7–10.7)

## 2020-07-29 LAB — CBC: RBC: 4.11 (ref 3.87–5.11)

## 2020-08-03 ENCOUNTER — Inpatient Hospital Stay: Payer: Medicaid Other

## 2020-08-03 ENCOUNTER — Ambulatory Visit: Payer: Medicaid Other | Admitting: Oncology

## 2020-08-04 ENCOUNTER — Inpatient Hospital Stay: Payer: Medicaid Other

## 2020-08-04 ENCOUNTER — Other Ambulatory Visit: Payer: Self-pay

## 2020-08-04 ENCOUNTER — Ambulatory Visit: Payer: Medicaid Other

## 2020-08-04 VITALS — BP 173/79 | HR 89 | Temp 98.8°F | Resp 18 | Ht 65.0 in | Wt 150.2 lb

## 2020-08-04 DIAGNOSIS — C155 Malignant neoplasm of lower third of esophagus: Secondary | ICD-10-CM

## 2020-08-04 DIAGNOSIS — Z5111 Encounter for antineoplastic chemotherapy: Secondary | ICD-10-CM | POA: Diagnosis not present

## 2020-08-04 MED ORDER — LEUCOVORIN CALCIUM INJECTION 100 MG
50.0000 mg | Freq: Once | INTRAMUSCULAR | Status: AC
  Administered 2020-08-04: 50 mg via INTRAVENOUS
  Filled 2020-08-04: qty 2.5

## 2020-08-04 MED ORDER — FLUOROURACIL CHEMO INJECTION 2.5 GM/50ML
400.0000 mg/m2 | Freq: Once | INTRAVENOUS | Status: AC
  Administered 2020-08-04: 700 mg via INTRAVENOUS
  Filled 2020-08-04: qty 14

## 2020-08-04 MED ORDER — SODIUM CHLORIDE 0.9 % IV SOLN
10.0000 mg | Freq: Once | INTRAVENOUS | Status: AC
  Administered 2020-08-04: 10 mg via INTRAVENOUS
  Filled 2020-08-04: qty 10

## 2020-08-04 MED ORDER — SODIUM CHLORIDE 0.9 % IV SOLN
2400.0000 mg/m2 | INTRAVENOUS | Status: DC
  Administered 2020-08-04: 4300 mg via INTRAVENOUS
  Filled 2020-08-04: qty 86

## 2020-08-04 MED ORDER — ONDANSETRON HCL 4 MG/2ML IJ SOLN
INTRAMUSCULAR | Status: AC
  Filled 2020-08-04: qty 4

## 2020-08-04 MED ORDER — SODIUM CHLORIDE 0.9 % IV SOLN
Freq: Once | INTRAVENOUS | Status: AC
  Filled 2020-08-04: qty 250

## 2020-08-04 MED ORDER — ONDANSETRON HCL 4 MG/2ML IJ SOLN
8.0000 mg | Freq: Once | INTRAMUSCULAR | Status: AC
  Administered 2020-08-04: 8 mg via INTRAVENOUS

## 2020-08-04 NOTE — Progress Notes (Signed)
PT STABLE AT TIME OF DISCHARGE 

## 2020-08-04 NOTE — Patient Instructions (Signed)
[facility] Discharge Instructions for Patients Receiving Chemotherapy  Today you received the following chemotherapy agents Leucovorin,Fluoorouracil  To help prevent nausea and vomiting after your treatment, we encourage you to take your nausea medication as directed.   If you develop nausea and vomiting that is not controlled by your nausea medication, call the clinic.   BELOW ARE SYMPTOMS THAT SHOULD BE REPORTED IMMEDIATELY:  *FEVER GREATER THAN 100.5 F  *CHILLS WITH OR WITHOUT FEVER  NAUSEA AND VOMITING THAT IS NOT CONTROLLED WITH YOUR NAUSEA MEDICATION  *UNUSUAL SHORTNESS OF BREATH  *UNUSUAL BRUISING OR BLEEDING  TENDERNESS IN MOUTH AND THROAT WITH OR WITHOUT PRESENCE OF ULCERS  *URINARY PROBLEMS  *BOWEL PROBLEMS  UNUSUAL RASH Items with * indicate a potential emergency and should be followed up as soon as possible.  Feel free to call the clinic should you have any questions or concerns at The clinic phone number is [phone].  Please show the [facility] at check-in to the Emergency Department and triage nurse.

## 2020-08-05 ENCOUNTER — Ambulatory Visit: Payer: Medicaid Other

## 2020-08-06 ENCOUNTER — Inpatient Hospital Stay: Payer: Medicaid Other | Attending: Oncology

## 2020-08-06 ENCOUNTER — Other Ambulatory Visit: Payer: Self-pay | Admitting: Oncology

## 2020-08-06 ENCOUNTER — Other Ambulatory Visit: Payer: Self-pay

## 2020-08-06 VITALS — BP 147/75 | HR 76 | Temp 98.6°F | Resp 18 | Ht 65.0 in | Wt 153.2 lb

## 2020-08-06 DIAGNOSIS — C155 Malignant neoplasm of lower third of esophagus: Secondary | ICD-10-CM

## 2020-08-06 DIAGNOSIS — C160 Malignant neoplasm of cardia: Secondary | ICD-10-CM | POA: Diagnosis present

## 2020-08-06 DIAGNOSIS — C778 Secondary and unspecified malignant neoplasm of lymph nodes of multiple regions: Secondary | ICD-10-CM | POA: Diagnosis present

## 2020-08-06 DIAGNOSIS — Z452 Encounter for adjustment and management of vascular access device: Secondary | ICD-10-CM | POA: Diagnosis present

## 2020-08-06 MED ORDER — SODIUM CHLORIDE 0.9% FLUSH
10.0000 mL | INTRAVENOUS | Status: DC | PRN
  Administered 2020-08-06: 10 mL
  Filled 2020-08-06: qty 10

## 2020-08-06 MED ORDER — HEPARIN SOD (PORK) LOCK FLUSH 100 UNIT/ML IV SOLN
500.0000 [IU] | Freq: Once | INTRAVENOUS | Status: AC | PRN
  Administered 2020-08-06: 500 [IU]
  Filled 2020-08-06: qty 5

## 2020-08-06 NOTE — Progress Notes (Signed)
PT STABLE AT TIME OF DISCHARGE 

## 2020-08-06 NOTE — Patient Instructions (Signed)
[facility] Discharge Instructions for Patients Receiving Chemotherapy  Today you received the following chemotherapy agents Flourouracil  To help prevent nausea and vomiting after your treatment, we encourage you to take your nausea medication.    If you develop nausea and vomiting that is not controlled by your nausea medication, call the clinic.   BELOW ARE SYMPTOMS THAT SHOULD BE REPORTED IMMEDIATELY:  *FEVER GREATER THAN 100.5 F  *CHILLS WITH OR WITHOUT FEVER  NAUSEA AND VOMITING THAT IS NOT CONTROLLED WITH YOUR NAUSEA MEDICATION  *UNUSUAL SHORTNESS OF BREATH  *UNUSUAL BRUISING OR BLEEDING  TENDERNESS IN MOUTH AND THROAT WITH OR WITHOUT PRESENCE OF ULCERS  *URINARY PROBLEMS  *BOWEL PROBLEMS  UNUSUAL RASH Items with * indicate a potential emergency and should be followed up as soon as possible.  Feel free to call the clinic should you have any questions or concerns at The clinic phone number is [phone].  Please show the [facility] at check-in to the Emergency Department and triage nurse.

## 2020-08-23 ENCOUNTER — Other Ambulatory Visit: Payer: Self-pay | Admitting: Oncology

## 2020-08-23 NOTE — Progress Notes (Signed)
[facility]  [address] [place],  [place]  [address] [phone]  Clinic Day:  07/29/2020  Referring physician: Bonnita Nasuti, MD   HISTORY OF PRESENT ILLNESS:  The patient is a 59 y.o. 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  He comes in today to go over his CT scans to ascertain his new disease baseline after receiving 20 cycles of infusional 5-fluorouracil/leucovorin.  Since his last visit, the patient has had more problems.  He feels more bloated and distended.  He also complains of early satiety. His right upper quadrant discomfort appears to be more prominent than previously.    PHYSICAL EXAM:  Blood pressure (!) 156/74, pulse 85, temperature 97.9 F (36.6 C), resp. rate 16, height 5\' 5"  (1.651 m), weight 155 lb 12.8 oz (70.7 kg), SpO2 92 %. Wt Readings from Last 3 Encounters:  08/24/20 155 lb 12.8 oz (70.7 kg)  08/06/20 153 lb 4 oz (69.5 kg)  08/04/20 150 lb 4 oz (68.2 kg)   Body mass index is 25.93 kg/m. Performance status (ECOG): 1 Physical Exam Constitutional:      Appearance: Normal appearance. He is not ill-appearing.  HENT:     Mouth/Throat:     Mouth: Mucous membranes are moist.     Pharynx: Oropharynx is clear. No oropharyngeal exudate or posterior oropharyngeal erythema.  Cardiovascular:     Rate and Rhythm: Normal rate and regular rhythm.     Heart sounds: No murmur heard. No friction rub. No gallop.   Pulmonary:     Effort: Pulmonary effort is normal. No respiratory distress.     Breath sounds: Normal breath sounds. No wheezing, rhonchi or rales.  Chest:  Breasts:     Right: No axillary adenopathy or supraclavicular adenopathy.     Left: No axillary adenopathy or supraclavicular adenopathy.    Abdominal:     General: Bowel sounds are normal. There is distension (more distension versus previous visits; PEG  tube area is not infected).     Palpations: Abdomen is soft. There is no mass.     Tenderness: There is no abdominal tenderness.  Musculoskeletal:        General: No swelling.     Right lower leg: No edema.     Left lower leg: No edema.  Lymphadenopathy:     Cervical: No cervical adenopathy.     Upper Body:     Right upper body: No supraclavicular or axillary adenopathy.     Left upper body: No supraclavicular or axillary adenopathy.     Lower Body: No right inguinal adenopathy. No left inguinal adenopathy.  Skin:    General: Skin is warm.     Coloration: Skin is not jaundiced.     Findings: No lesion or rash.  Neurological:     General: No focal deficit present.     Mental Status: He is alert and oriented to person, place, and time. Mental status is at baseline.     Cranial Nerves: Cranial nerves are intact.  Psychiatric:        Mood and Affect: Mood normal.        Behavior: Behavior normal.        Thought Content: Thought content normal.   SCANS:  CT scans of his chest/abdomen/pelvis revealed the following: FINDINGS: CT CHEST FINDINGS  Cardiovascular: The heart size is within normal limits. No pericardial effusion identified. Aortic  atherosclerosis. Coronary artery calcifications.  Mediastinum/Nodes: Normal appearance of the thyroid gland. The trachea appears patent and is midline. Stable appearance of the esophagus.  No enlarged axillary, supraclavicular, or mediastinal lymph nodes. No hilar adenopathy. Previous index left pre-vascular lymph node within the anterior mediastinum has a short axis of 7 mm, image 17/2. Previously 8 mm  Lungs/Pleura: Progressive thickening and enhancement of the hemidiaphragms noted, image 32/601. Moderate bilateral pleural effusions are increased in volume from previous exam and now appear partially loculated. New compressive type atelectasis involving the lingula, left lower lobe, and right lung base. Moderate to marked centrilobular  and paraseptal emphysema identified. No suspicious lung nodule or mass.  Musculoskeletal: Bones appear mildly osteopenic. No acute or suspicious osseous findings.  CT ABDOMEN PELVIS FINDINGS  Hepatobiliary: Ill-defined areas of low attenuation within the left hepatic lobe appear new from previous study including 8 mm subcapsular lesion within the lateral segment of left hepatic lobe, image 54/2. Status post cholecystectomy. No biliary ductal dilatation.  Pancreas: Unremarkable. No pancreatic ductal dilatation or surrounding inflammatory changes.  Spleen: Normal in size without focal abnormality.  Adrenals/Urinary Tract: Normal appearance of the left adrenal gland. Small low-density right adrenal nodule is stable measuring 0.8 x 0.9 cm, image 56/2. Small low-density structure within the posterior right kidney is too small to characterize measuring 5 mm. No hydronephrosis or mass identified bilaterally.  Stomach/Bowel: Postsurgical changes within the left upper quadrant of the abdomen in the region of the GE junction are again noted and do not appear significantly changed in the interval. Margins of the underlying treated tumor are difficult to identify and therefore measure. Gastrostomy tube is noted within the gastric lumen. No pathologic dilatation of the large or small bowel loops.  Vascular/Lymphatic: Aortic atherosclerosis. No aneurysm. No adenopathy within the abdomen or pelvis.  Reproductive: Prostate is unremarkable.  Other: Interval development of upper abdominal ascites. There is soft tissue infiltration within the omentum which appears new from previous exam and is suspicious for peritoneal carcinomatosis. This is best seen along the inferior margin of the right lobe of liver adjacent to the hepatic flexure, image 20/601a and image 70/2.Marland Kitchen  Musculoskeletal: No aggressive lytic or sclerotic bone lesions. No acute abnormality.  IMPRESSION: 1. Interval development  of upper abdominal ascites with new soft tissue infiltration within the omentum suspicious for peritoneal carcinomatosis. Consider diagnostic paracentesis to assess for peritoneal disease. 2. New, ill-defined areas of low attenuation within the left hepatic lobe are identified. Cannot exclude metastatic disease. Consider further evaluation with contrast enhanced MRI of the liver. 3. Progressive thickening and enhancement of bilateral hemidiaphragms. Additionally, there has been interval increase in volume of bilateral pleural effusions which now appear partially loculated. Consider further investigation with diagnostic thoracentesis to assess for malignant effusion. 4. Aortic Atherosclerosis (ICD10-I70.0) and Emphysema (ICD10-J43.9). Coronary artery calcifications noted.   LABS:    ASSESSMENT & PLAN:  Assessment/Plan:  A 59 y.o. 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  In clinic today, I went over his CT scan images with him, for which he could see evidence of what appears to be disease progression, including peritoneal carcinomatosis.  He also has bilateral pleural effusions, which are new.  In speaking with interventional radiology, they feel the best site to go after is a peritoneal deposit likely highlighting his carcinomatosis.  I will arrange for this to be done in the forthcoming week.  I will see him back in 2 weeks to go over his biopsy results and  their implications.  Although very despondent, the patient and his understand all the plans discussed today and are in agreement with them.     Kenyon Eichelberger Macarthur Critchley, MD

## 2020-08-24 ENCOUNTER — Other Ambulatory Visit: Payer: Self-pay

## 2020-08-24 ENCOUNTER — Encounter: Payer: Self-pay | Admitting: Oncology

## 2020-08-24 ENCOUNTER — Other Ambulatory Visit: Payer: Self-pay | Admitting: Oncology

## 2020-08-24 ENCOUNTER — Telehealth: Payer: Self-pay | Admitting: Oncology

## 2020-08-24 ENCOUNTER — Inpatient Hospital Stay (INDEPENDENT_AMBULATORY_CARE_PROVIDER_SITE_OTHER): Payer: Medicaid Other | Admitting: Oncology

## 2020-08-24 VITALS — BP 156/74 | HR 85 | Temp 97.9°F | Resp 16 | Ht 65.0 in | Wt 155.8 lb

## 2020-08-24 DIAGNOSIS — C155 Malignant neoplasm of lower third of esophagus: Secondary | ICD-10-CM

## 2020-08-24 MED ORDER — IBUPROFEN 600 MG PO TABS: 600.0000 mg | ORAL_TABLET | Freq: Four times a day (QID) | ORAL | 0 refills | Status: AC | PRN

## 2020-08-24 MED ORDER — OXYCODONE HCL 5 MG PO TABS
5.0000 mg | ORAL_TABLET | ORAL | 0 refills | Status: DC | PRN
Start: 2020-08-24 — End: 2020-09-10

## 2020-08-24 NOTE — Telephone Encounter (Signed)
Per 12/20 los next appt given to patient 

## 2020-08-25 ENCOUNTER — Inpatient Hospital Stay: Payer: Medicaid Other

## 2020-08-26 ENCOUNTER — Telehealth: Payer: Self-pay | Admitting: Oncology

## 2020-08-26 NOTE — Telephone Encounter (Signed)
Patient called to see if his Biopsy has been scheduled, per Tammy Information has been faxed IR for scheduling.  Should hear tomorrow when Appt has been scheduled

## 2020-08-31 ENCOUNTER — Encounter: Payer: Self-pay | Admitting: Oncology

## 2020-09-03 DIAGNOSIS — J90 Pleural effusion, not elsewhere classified: Secondary | ICD-10-CM

## 2020-09-06 NOTE — Progress Notes (Deleted)
[facility]  [address] [place],  [place]  [address] [phone]  Clinic Day:  07/29/2020  Referring physician: Galvin Proffer, MD   HISTORY OF PRESENT ILLNESS:  The patient is a 60 y.o. male with metastatic GE junction adenocarcinoma, which includes extensive nodal metastasis from his mediastinum down to his retroperitoneum.  Recent scans after 20 cycles of infusional 5-fluorouracil/leucovorin-based chemotherapy also showed new, bilateral pleural effusions, as well as peritoneal carcinomatosis.  He comes in today to go over biopsy results to prove that he definitely has new areas of disease metastasis.   Unfortunately, he was hospitalized over the past week due to a progressive decline in his health.  PHYSICAL EXAM:  There were no vitals taken for this visit. Wt Readings from Last 3 Encounters:  08/24/20 155 lb 12.8 oz (70.7 kg)  08/06/20 153 lb 4 oz (69.5 kg)  08/04/20 150 lb 4 oz (68.2 kg)   There is no height or weight on file to calculate BMI. Performance status (ECOG): 1 Physical Exam Constitutional:      Appearance: Normal appearance. He is not ill-appearing.  HENT:     Mouth/Throat:     Mouth: Mucous membranes are moist.     Pharynx: Oropharynx is clear. No oropharyngeal exudate or posterior oropharyngeal erythema.  Cardiovascular:     Rate and Rhythm: Normal rate and regular rhythm.     Heart sounds: No murmur heard. No friction rub. No gallop.   Pulmonary:     Effort: Pulmonary effort is normal. No respiratory distress.     Breath sounds: Normal breath sounds. No wheezing, rhonchi or rales.  Chest:  Breasts:     Right: No axillary adenopathy or supraclavicular adenopathy.     Left: No axillary adenopathy or supraclavicular adenopathy.    Abdominal:     General: Bowel sounds are normal. There is distension (more distension versus previous visits; PEG tube area is not infected).     Palpations: Abdomen is soft. There is no  mass.     Tenderness: There is no abdominal tenderness.  Musculoskeletal:        General: No swelling.     Right lower leg: No edema.     Left lower leg: No edema.  Lymphadenopathy:     Cervical: No cervical adenopathy.     Upper Body:     Right upper body: No supraclavicular or axillary adenopathy.     Left upper body: No supraclavicular or axillary adenopathy.     Lower Body: No right inguinal adenopathy. No left inguinal adenopathy.  Skin:    General: Skin is warm.     Coloration: Skin is not jaundiced.     Findings: No lesion or rash.  Neurological:     General: No focal deficit present.     Mental Status: He is alert and oriented to person, place, and time. Mental status is at baseline.     Cranial Nerves: Cranial nerves are intact.  Psychiatric:        Mood and Affect: Mood normal.        Behavior: Behavior normal.        Thought Content: Thought content normal.   SCANS:  CT scans of his chest/abdomen/pelvis revealed the following: FINDINGS: CT CHEST FINDINGS  Cardiovascular: The heart size is within normal limits. No pericardial effusion identified. Aortic atherosclerosis. Coronary artery calcifications.  Mediastinum/Nodes: Normal appearance of the thyroid gland. The trachea appears patent and is midline. Stable appearance of the  esophagus.  No enlarged axillary, supraclavicular, or mediastinal lymph nodes. No hilar adenopathy. Previous index left pre-vascular lymph node within the anterior mediastinum has a short axis of 7 mm, image 17/2. Previously 8 mm  Lungs/Pleura: Progressive thickening and enhancement of the hemidiaphragms noted, image 32/601. Moderate bilateral pleural effusions are increased in volume from previous exam and now appear partially loculated. New compressive type atelectasis involving the lingula, left lower lobe, and right lung base. Moderate to marked centrilobular and paraseptal emphysema identified. No suspicious lung nodule or  mass.  Musculoskeletal: Bones appear mildly osteopenic. No acute or suspicious osseous findings.  CT ABDOMEN PELVIS FINDINGS  Hepatobiliary: Ill-defined areas of low attenuation within the left hepatic lobe appear new from previous study including 8 mm subcapsular lesion within the lateral segment of left hepatic lobe, image 54/2. Status post cholecystectomy. No biliary ductal dilatation.  Pancreas: Unremarkable. No pancreatic ductal dilatation or surrounding inflammatory changes.  Spleen: Normal in size without focal abnormality.  Adrenals/Urinary Tract: Normal appearance of the left adrenal gland. Small low-density right adrenal nodule is stable measuring 0.8 x 0.9 cm, image 56/2. Small low-density structure within the posterior right kidney is too small to characterize measuring 5 mm. No hydronephrosis or mass identified bilaterally.  Stomach/Bowel: Postsurgical changes within the left upper quadrant of the abdomen in the region of the GE junction are again noted and do not appear significantly changed in the interval. Margins of the underlying treated tumor are difficult to identify and therefore measure. Gastrostomy tube is noted within the gastric lumen. No pathologic dilatation of the large or small bowel loops.  Vascular/Lymphatic: Aortic atherosclerosis. No aneurysm. No adenopathy within the abdomen or pelvis.  Reproductive: Prostate is unremarkable.  Other: Interval development of upper abdominal ascites. There is soft tissue infiltration within the omentum which appears new from previous exam and is suspicious for peritoneal carcinomatosis. This is best seen along the inferior margin of the right lobe of liver adjacent to the hepatic flexure, image 20/601a and image 70/2.Marland Kitchen  Musculoskeletal: No aggressive lytic or sclerotic bone lesions. No acute abnormality.  IMPRESSION: 1. Interval development of upper abdominal ascites with new soft tissue infiltration  within the omentum suspicious for peritoneal carcinomatosis. Consider diagnostic paracentesis to assess for peritoneal disease. 2. New, ill-defined areas of low attenuation within the left hepatic lobe are identified. Cannot exclude metastatic disease. Consider further evaluation with contrast enhanced MRI of the liver. 3. Progressive thickening and enhancement of bilateral hemidiaphragms. Additionally, there has been interval increase in volume of bilateral pleural effusions which now appear partially loculated. Consider further investigation with diagnostic thoracentesis to assess for malignant effusion. 4. Aortic Atherosclerosis (ICD10-I70.0) and Emphysema (ICD10-J43.9). Coronary artery calcifications noted.   LABS:    ASSESSMENT & PLAN:  Assessment/Plan:  A 60 y.o. male with metastatic GE junction adenocarcinoma.  In clinic today, I went over his biopsy results with him.    The patient and his understand all the plans discussed today and are in agreement with them.     Marc Tarpley Macarthur Critchley, MD

## 2020-09-07 ENCOUNTER — Inpatient Hospital Stay: Payer: Medicaid Other | Attending: Oncology | Admitting: Oncology

## 2020-09-07 ENCOUNTER — Inpatient Hospital Stay: Payer: Medicaid Other

## 2020-09-08 ENCOUNTER — Other Ambulatory Visit: Payer: Self-pay | Admitting: Hematology and Oncology

## 2020-09-08 DIAGNOSIS — C155 Malignant neoplasm of lower third of esophagus: Secondary | ICD-10-CM

## 2020-09-09 ENCOUNTER — Other Ambulatory Visit: Payer: Self-pay

## 2020-09-09 ENCOUNTER — Telehealth: Payer: Self-pay | Admitting: Oncology

## 2020-09-09 DIAGNOSIS — R066 Hiccough: Secondary | ICD-10-CM

## 2020-09-09 NOTE — Telephone Encounter (Signed)
Called patient to change Appt Time on 1/11 to work in [place] Patient in the afternoon.  Labs 9:45 am - Follow Up 10:15 am.  Ok per patient

## 2020-09-09 NOTE — Telephone Encounter (Signed)
I called pt to ask who had given him the Baclofen in the past. He states Dr Melvyn Neth had given him script a while back for hiccups. I notified Melissa Parsons,NP. [phone]

## 2020-09-10 ENCOUNTER — Other Ambulatory Visit: Payer: Self-pay | Admitting: Oncology

## 2020-09-10 DIAGNOSIS — C155 Malignant neoplasm of lower third of esophagus: Secondary | ICD-10-CM

## 2020-09-10 MED ORDER — HYDROMORPHONE HCL 4 MG PO TABS: 4.0000 mg | ORAL_TABLET | ORAL | 0 refills | Status: AC | PRN

## 2020-09-10 MED ORDER — ONDANSETRON HCL 8 MG PO TABS: 8.0000 mg | ORAL_TABLET | Freq: Four times a day (QID) | ORAL | 3 refills | Status: AC | PRN

## 2020-09-10 MED ORDER — FENTANYL 12 MCG/HR TD PT72
1.0000 | MEDICATED_PATCH | TRANSDERMAL | Status: DC
Start: 2020-09-10 — End: 2020-09-11

## 2020-09-14 MED ORDER — BACLOFEN 10 MG PO TABS: 10.0000 mg | ORAL_TABLET | Freq: Three times a day (TID) | ORAL | 0 refills | Status: AC

## 2020-09-14 NOTE — Telephone Encounter (Signed)
sent 

## 2020-09-15 ENCOUNTER — Inpatient Hospital Stay: Payer: Medicaid Other

## 2020-09-15 ENCOUNTER — Inpatient Hospital Stay: Payer: Medicaid Other | Admitting: Oncology

## 2020-09-17 ENCOUNTER — Ambulatory Visit: Payer: Medicaid Other

## 2020-09-18 ENCOUNTER — Telehealth: Payer: Self-pay

## 2020-09-18 NOTE — Telephone Encounter (Signed)
Received call from [facility], with [facility]. She was notifying us that pt is being d/c from [facility] to home with Hospice services. She wanted to know if Dr Bobby Rumpf would like to be the attending, or would he prefer the Hospice providers to be attending. I notified Ebony Hail that Dr Bobby Rumpf prefers that his patients be treated by the Hospice providers.

## 2020-10-02 IMAGING — MR MRI HEAD WITHOUT CONTRAST
9 of 11 series · 34 of 48 positions shown · non-contrast
Comparison: Head CT 12/26/2018

CLINICAL DATA: Blurred vision for 1 week.

EXAM:
MRI HEAD WITHOUT CONTRAST
TECHNIQUE: Multiplanar, multiecho pulse sequences of the brain and surrounding
structures were obtained without intravenous contrast.

[Series 2: FLAIR · sagittal · 5.0mm · 0.47mm/px · 1 of 27 slices shown (1 of 2)]
[im 1/27]
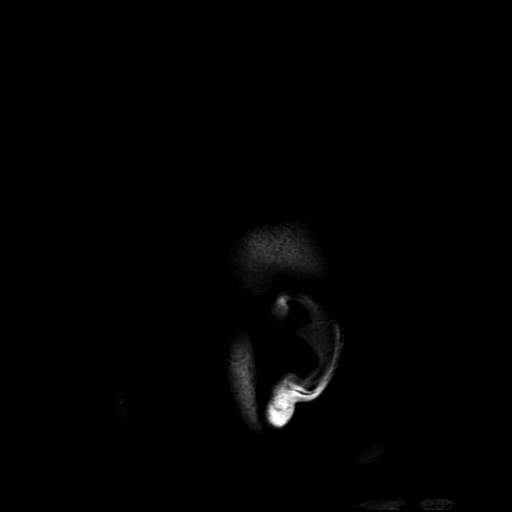

[Series 4: DWI · axial · 3.0mm · 0.94mm/px · z∈[-80,+82]mm · 8 of 110 slices shown (1 of 2)]
[im 1/110]
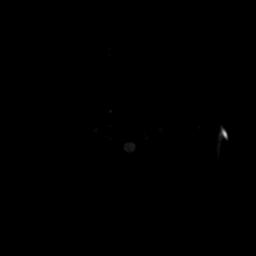
[im 16/110]
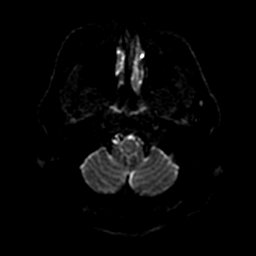
[im 32/110]
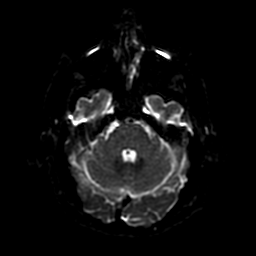
[im 47/110]
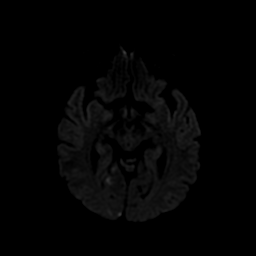
[im 63/110]
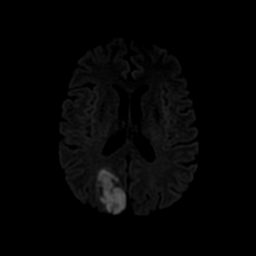
[im 78/110]
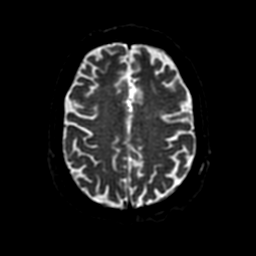
[im 94/110]
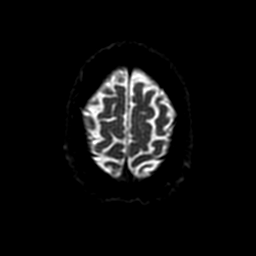
[im 110/110]
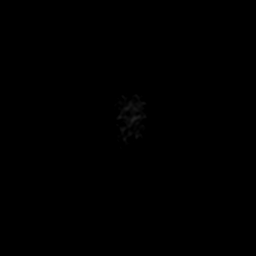

[Series 5: DWI · coronal · 4.0mm · 0.94mm/px · 6 of 78 slices shown (2 of 2)]
[im 1/78]
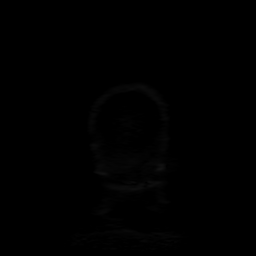
[im 16/78]
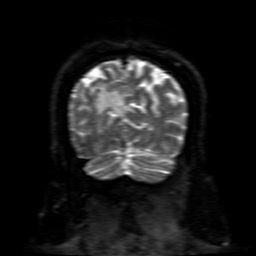
[im 31/78]
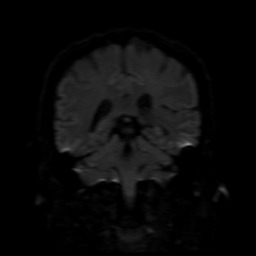
[im 47/78]
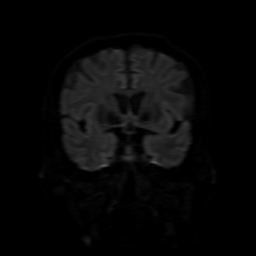
[im 62/78]
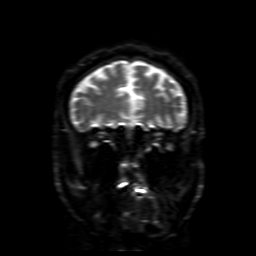
[im 78/78]
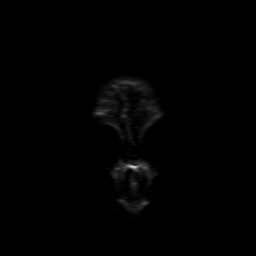

[Series 6: T2 · axial · 5.0mm · 0.47mm/px · z∈[-80,+82]mm · 2 of 28 slices shown]
[im 1/28]
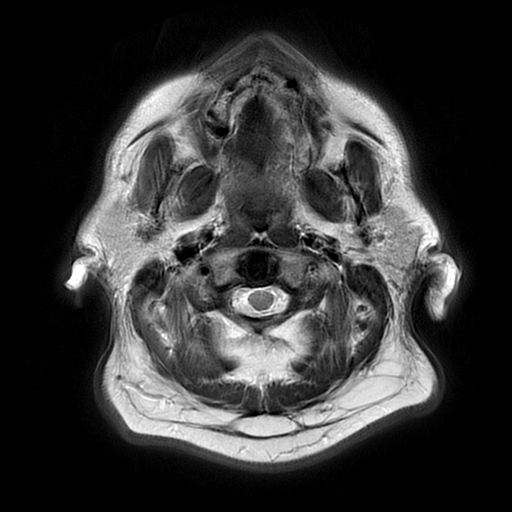
[im 28/28]
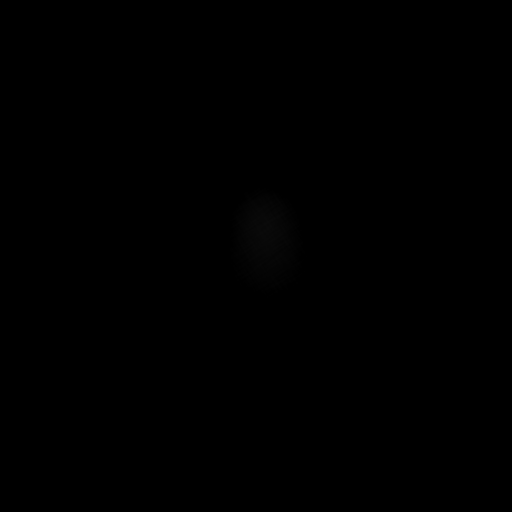

[Series 7: FLAIR · axial · 3.0mm · 0.47mm/px · z∈[-80,+82]mm · 2 of 28 slices shown (2 of 2)]
[im 1/28]
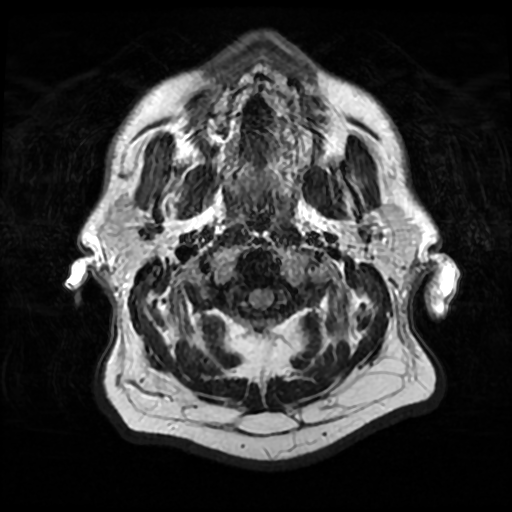
[im 28/28]
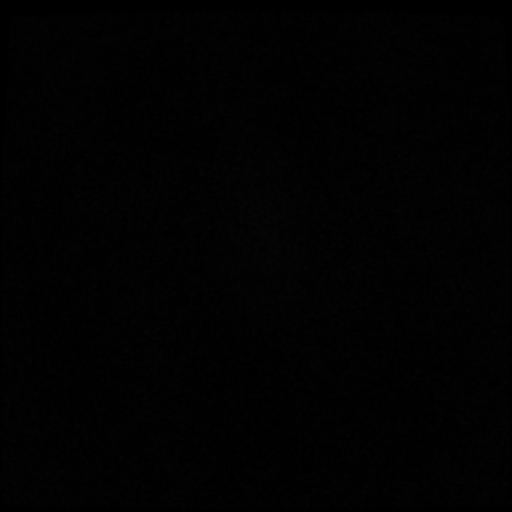

[Series 8: (person_name) · axial · 3.0mm · 0.47mm/px · z∈[-81,+37]mm · 6 of 112 slices shown]
[im 1/112]
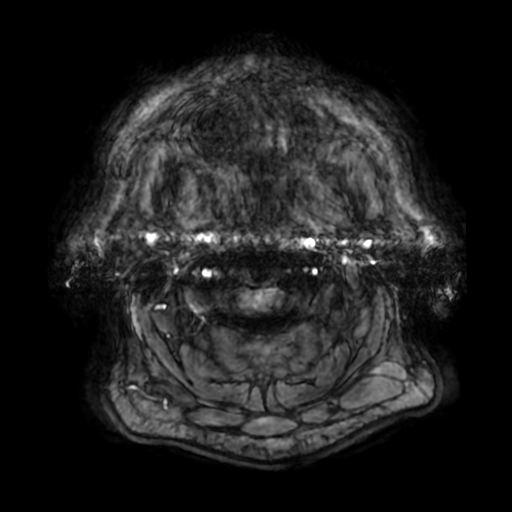
[im 16/112]
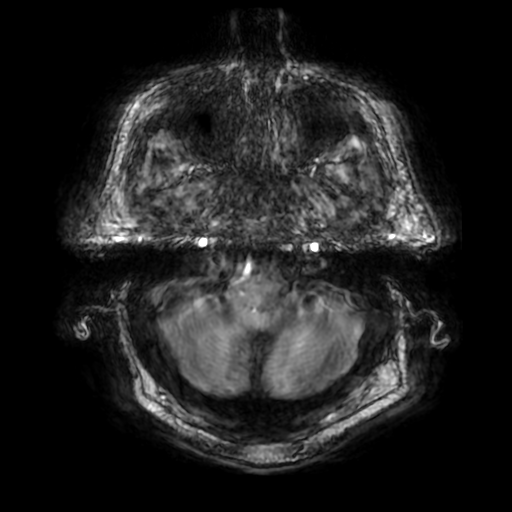
[im 32/112]
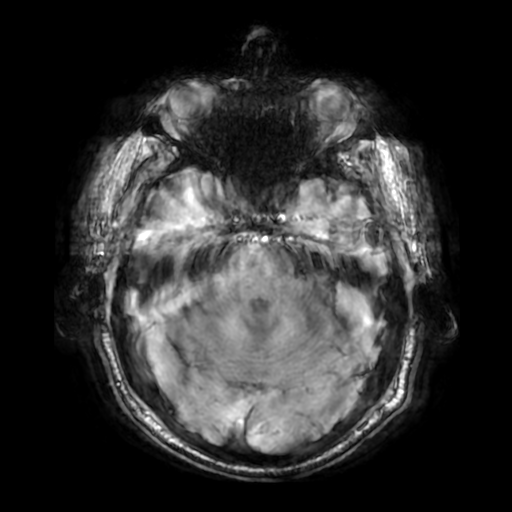
[im 48/112]
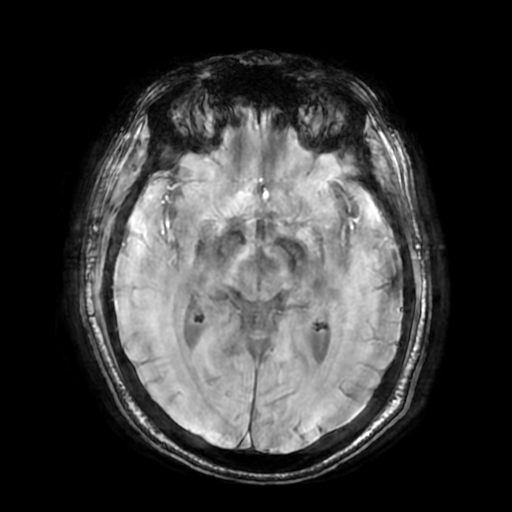
[im 64/112]
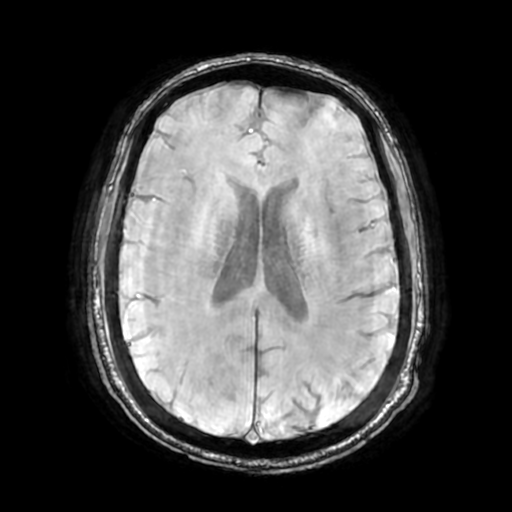
[im 80/112]
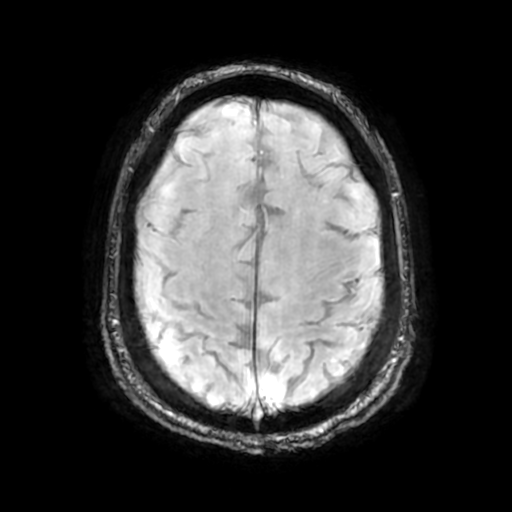

[Series 10: T2 post-contrast · coronal · 5.0mm · 0.47mm/px · 2 of 33 slices shown]
[im 1/33]
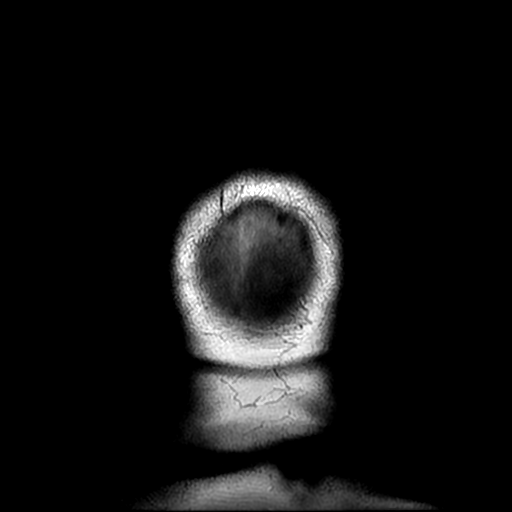
[im 33/33]
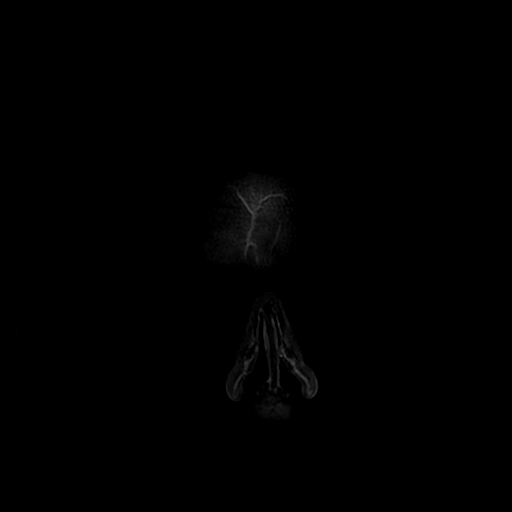

[Series 450: ADC · axial · 3.0mm · 0.94mm/px · z∈[-80,+82]mm · 4 of 54 slices shown (1 of 2)]
[im 1/54]
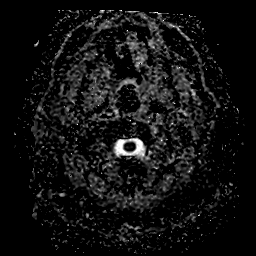
[im 18/54]
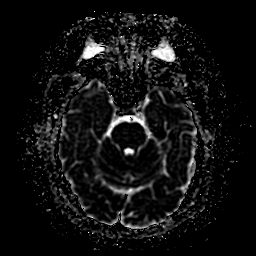
[im 36/54]
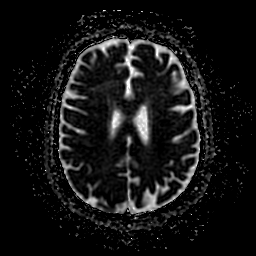
[im 54/54]
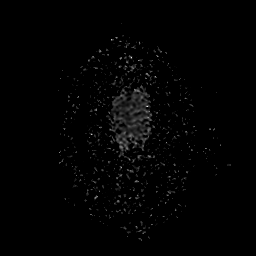

[Series 550: ADC · coronal · 4.0mm · 0.94mm/px · 3 of 39 slices shown (2 of 2)]
[im 1/39]
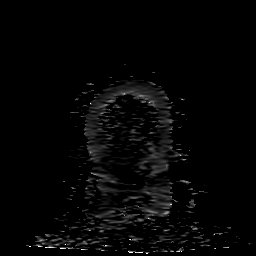
[im 20/39]
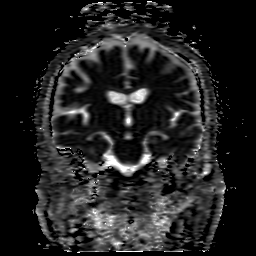
[im 39/39]
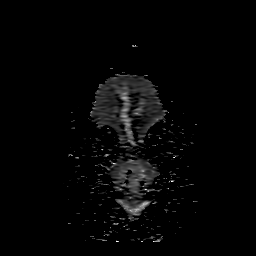

[34 of 48 positions shown; findings below may reference images not displayed]

FINDINGS: BRAIN: Intermediate sized right PCA territory infarct. Moderate
cytotoxic edema without significant mass effect. There is an old
infarct of the right pons. The midline structures are normal. White
matter signal is normal outside of the infarcted area. The cerebral
and cerebellar volume are age-appropriate. No hydrocephalus.
Susceptibility-sensitive sequences show no chronic microhemorrhage
or superficial siderosis. No mass lesion.

VASCULAR: The major intracranial arterial and venous sinus flow
voids are normal.

SKULL AND UPPER CERVICAL SPINE: Calvarial bone marrow signal is
normal. There is no skull base mass. Visualized upper cervical spine
and soft tissues are normal.

SINUSES/ORBITS: No fluid levels or advanced mucosal thickening. No
mastoid or middle ear effusion. The orbits are normal.
IMPRESSION: Intermediate sized right PCA territory acute infarct. No hemorrhage
or mass effect.

## 2020-10-02 IMAGING — CT CT ANGIOGRAPHY NECK
1 of 12 series · 5 of 33 positions shown · IV contrast (APPLIED)
Comparison: CT head earlier today at [facility].

CLINICAL DATA: Abnormal CT. Blurred vision. Dizziness. Loss of
peripheral vision.

EXAM:
CT ANGIOGRAPHY HEAD AND NECK
TECHNIQUE: Multidetector CT imaging of the head and neck was performed using
the standard protocol during bolus administration of intravenous
contrast. Multiplanar CT image reconstructions and MIPs were
obtained to evaluate the vascular anatomy. Carotid stenosis
measurements (when applicable) are obtained utilizing NASCET
criteria, using the distal internal carotid diameter as the
denominator.
CONTRAST:  100mL OMNIPAQUE IOHEXOL 350 MG/ML SOLN

[Series 11: ax thins · axial · 0.39mm/px · z∈[-255,-19]mm · 5 of 354 slices shown]
[im 59/354  soft-tissue]
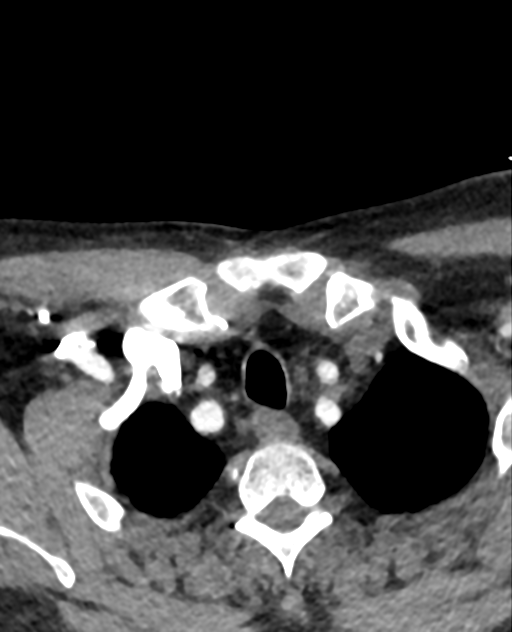
[im 118/354  bone]
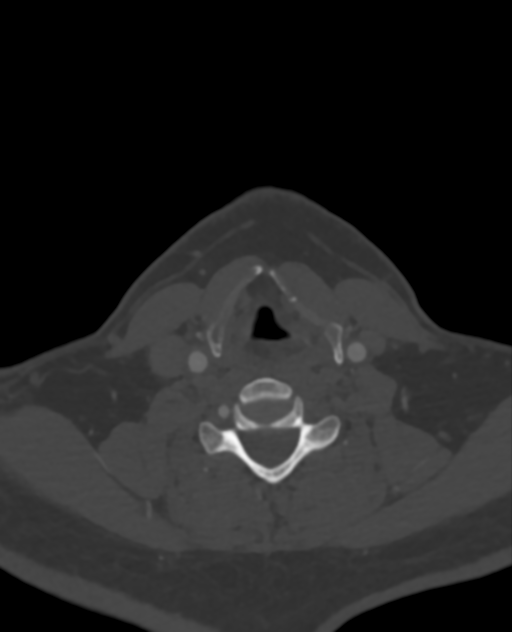
[im 177/354  soft-tissue]
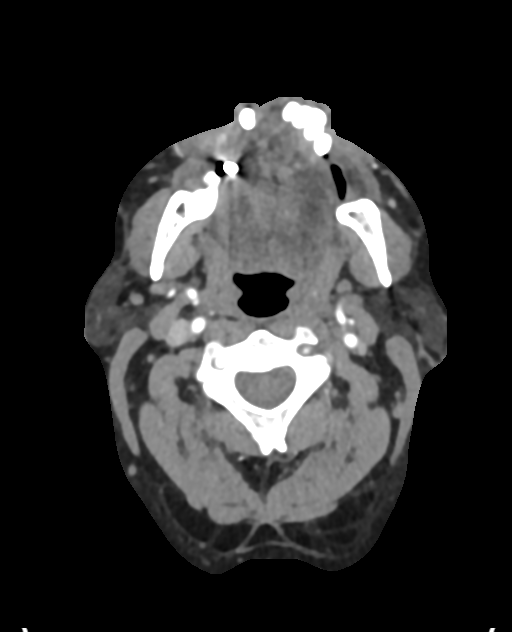
[im 236/354  bone]
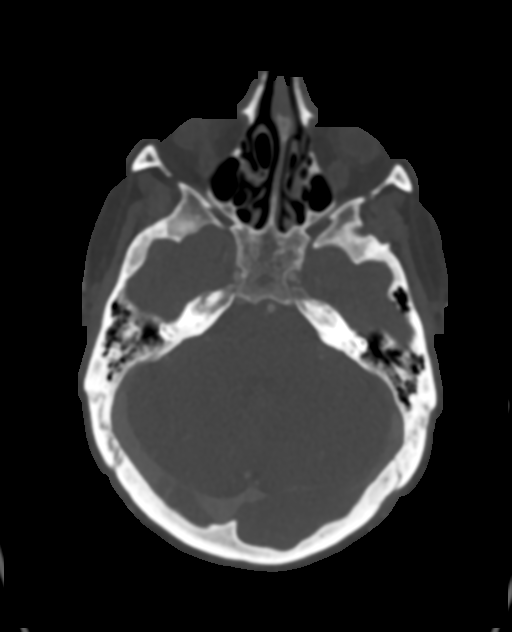
[im 295/354  soft-tissue]
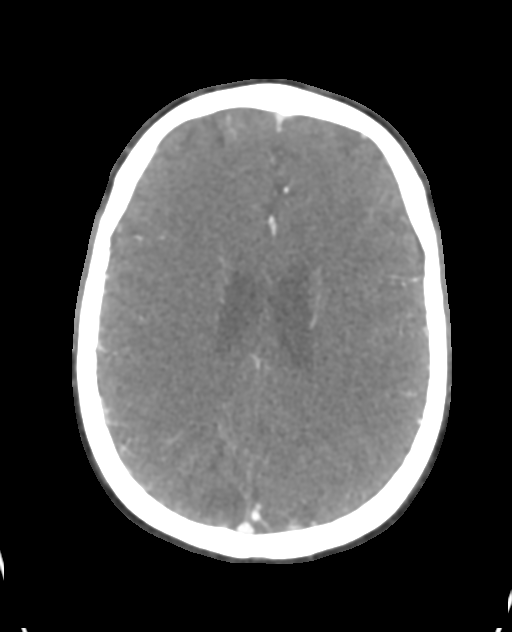

[5 of 33 positions shown; findings below may reference images not displayed]

FINDINGS: CT HEAD FINDINGS

Brain: Acute appearing RIGHT PCA territory infarct, no definite
hemorrhage, primarily affecting the posterior lobe and occipital
lobe on the RIGHT. No other areas of concern for ischemia. No
hemorrhage, mass lesion, hydrocephalus, or extra-axial fluid.

Vascular: Reported separately.

Skull: Normal. Negative for fracture or focal lesion.

Sinuses: Imaged portions are clear.

Orbits: No acute finding.

Review of the MIP images confirms the above findings

CTA NECK FINDINGS

Aortic arch: Standard branching. Imaged portion shows no evidence of
aneurysm or dissection. No significant stenosis of the major arch
vessel origins. Aortic atherosclerosis.

Right carotid system: Calcified and noncalcified stenosis proximal
RIGHT internal carotid artery, approximate 60-70% stenosis, based on
luminal measurements of 2.0/4.6 proximal/distal. This is borderline
flow reducing. More distally, no dissection or fibromuscular change.

Left carotid system: No evidence of dissection, stenosis (50% or
greater) or occlusion. Nonstenotic atheromatous change at the
bifurcation, less severe than on the RIGHT.

Vertebral arteries: RIGHT vertebral artery is patent throughout and
dominant contributor to the basilar. LEFT vertebral is occluded in
its V1 segment. Muscular collaterals reconstitute V2 and V3. Vessel
terminates in PICA.

Skeleton: Spondylosis.  Patient is largely edentulous.

Other neck: No masses.  Airway patent.

Upper chest: Severe emphysematous change.  No pneumothorax.

Review of the MIP images confirms the above findings

CTA HEAD FINDINGS

Anterior circulation: 50% stenosis of the inferior cavernous ICA on
the LEFT. Additional 50-75% stenosis of the supraclinoid ICA on the
LEFT. ICA terminus widely patent.

Nonstenotic atheromatous change of the cavernous and supraclinoid
ICA on the RIGHT. ICA terminus widely patent. Anterior and middle
cerebral arteries are free from proximal disease. Rudimentary RIGHT
PCOM. Mild irregularity of the BILATERAL M3 MCA branches.

Posterior circulation: RIGHT vertebral is the dominant/sole
contributor to the basilar. The basilar is widely patent. The P1,
P2, P3 segments of the posterior cerebral arteries bilaterally are
mildly irregular, but patent without focal stenosis. No cerebellar
branch occlusion.

Venous sinuses: As permitted by contrast timing, patent.

Anatomic variants: None of significance. Specifically no evidence of
fetal RIGHT PCA.

Delayed phase: No abnormal postcontrast enhancement. Well demarcated
RIGHT PCA territory infarct.

Review of the MIP images confirms the above findings
IMPRESSION: Bland RIGHT PCA territory infarct without significant disease of the
dominant RIGHT vertebral, basilar, or RIGHT PCA. No evidence of
fetal PCA anatomy.

Extracranial atheromatous change at the carotid bifurcations, not
clearly flow reducing. 60-70% stenosis on the RIGHT, less than 50%
stenosis on the LEFT.

Non dominant LEFT vertebral terminates in PICA. Muscular collaterals
reconstitute the LEFT vertebral in the neck.

Aortic Atherosclerosis (3IX45-87L.L) and Emphysema ([id number].6).

## 2020-10-06 ENCOUNTER — Ambulatory Visit: Payer: Medicaid Other | Admitting: Oncology

## 2020-10-06 ENCOUNTER — Other Ambulatory Visit: Payer: Medicaid Other

## 2020-10-06 DEATH — deceased
